# Patient Record
Sex: Female | Born: 1940 | Race: White | Hispanic: No | Smoking: Never smoker
Health system: Southern US, Community
[De-identification: ages and names within clinical notes are randomized; demographics above are authoritative.]

## PROBLEM LIST (undated history)

## (undated) DIAGNOSIS — K5792 Diverticulitis of intestine, part unspecified, without perforation or abscess without bleeding: Secondary | ICD-10-CM

## (undated) DIAGNOSIS — I10 Essential (primary) hypertension: Secondary | ICD-10-CM

## (undated) DIAGNOSIS — G2581 Restless legs syndrome: Secondary | ICD-10-CM

## (undated) DIAGNOSIS — M1990 Unspecified osteoarthritis, unspecified site: Secondary | ICD-10-CM

## (undated) DIAGNOSIS — L409 Psoriasis, unspecified: Secondary | ICD-10-CM

## (undated) HISTORY — DX: Psoriasis, unspecified: L40.9

## (undated) HISTORY — DX: Diverticulitis of intestine, part unspecified, without perforation or abscess without bleeding: K57.92

## (undated) HISTORY — DX: Unspecified osteoarthritis, unspecified site: M19.90

## (undated) HISTORY — DX: Essential (primary) hypertension: I10

---

## 1997-03-10 HISTORY — PX: TENDON RELEASE: SHX230

## 2000-11-08 ENCOUNTER — Encounter: Payer: Self-pay | Admitting: Internal Medicine

## 2000-11-08 LAB — CONVERTED CEMR LAB: Pap Smear: NORMAL

## 2000-12-09 LAB — FECAL OCCULT BLOOD, GUAIAC: Fecal Occult Blood: NEGATIVE

## 2003-12-01 ENCOUNTER — Other Ambulatory Visit: Admission: RE | Admit: 2003-12-01 | Discharge: 2003-12-01 | Payer: Self-pay | Admitting: Internal Medicine

## 2005-07-04 ENCOUNTER — Ambulatory Visit: Payer: Self-pay | Admitting: Internal Medicine

## 2005-07-04 ENCOUNTER — Other Ambulatory Visit: Admission: RE | Admit: 2005-07-04 | Discharge: 2005-07-04 | Payer: Self-pay | Admitting: Internal Medicine

## 2005-07-12 ENCOUNTER — Encounter: Payer: Self-pay | Admitting: Internal Medicine

## 2005-08-21 ENCOUNTER — Ambulatory Visit: Payer: Self-pay | Admitting: Internal Medicine

## 2005-08-30 ENCOUNTER — Ambulatory Visit: Payer: Self-pay | Admitting: Internal Medicine

## 2006-07-11 ENCOUNTER — Ambulatory Visit: Payer: Self-pay | Admitting: Internal Medicine

## 2006-08-05 ENCOUNTER — Ambulatory Visit: Payer: Self-pay | Admitting: Internal Medicine

## 2006-09-11 ENCOUNTER — Ambulatory Visit: Payer: Self-pay | Admitting: Internal Medicine

## 2010-11-03 ENCOUNTER — Encounter: Payer: Self-pay | Admitting: Internal Medicine

## 2010-11-03 ENCOUNTER — Encounter (INDEPENDENT_AMBULATORY_CARE_PROVIDER_SITE_OTHER): Payer: 59 | Admitting: Internal Medicine

## 2010-11-03 ENCOUNTER — Other Ambulatory Visit: Payer: Self-pay | Admitting: Internal Medicine

## 2010-11-03 DIAGNOSIS — I10 Essential (primary) hypertension: Secondary | ICD-10-CM

## 2010-11-03 DIAGNOSIS — Z23 Encounter for immunization: Secondary | ICD-10-CM

## 2010-11-03 DIAGNOSIS — M159 Polyosteoarthritis, unspecified: Secondary | ICD-10-CM | POA: Insufficient documentation

## 2010-11-03 DIAGNOSIS — R32 Unspecified urinary incontinence: Secondary | ICD-10-CM | POA: Insufficient documentation

## 2010-11-03 DIAGNOSIS — M130 Polyarthritis, unspecified: Secondary | ICD-10-CM | POA: Insufficient documentation

## 2010-11-03 DIAGNOSIS — K5730 Diverticulosis of large intestine without perforation or abscess without bleeding: Secondary | ICD-10-CM | POA: Insufficient documentation

## 2010-11-03 DIAGNOSIS — Z0000 Encounter for general adult medical examination without abnormal findings: Secondary | ICD-10-CM

## 2010-11-03 DIAGNOSIS — Z2911 Encounter for prophylactic immunotherapy for respiratory syncytial virus (RSV): Secondary | ICD-10-CM

## 2010-11-03 DIAGNOSIS — M1990 Unspecified osteoarthritis, unspecified site: Secondary | ICD-10-CM | POA: Insufficient documentation

## 2010-11-03 LAB — RENAL FUNCTION PANEL
Albumin: 4 g/dL (ref 3.5–5.2)
BUN: 22 mg/dL (ref 6–23)
CO2: 28 mEq/L (ref 19–32)
Calcium: 9.7 mg/dL (ref 8.4–10.5)
Chloride: 108 mEq/L (ref 96–112)
Creatinine, Ser: 0.7 mg/dL (ref 0.4–1.2)
GFR: 90.9 mL/min (ref >60.00)

## 2010-11-03 LAB — HEPATIC FUNCTION PANEL
ALT: 18 U/L (ref 0–35)
AST: 23 U/L (ref 0–37)
Albumin: 4 g/dL (ref 3.5–5.2)
Alkaline Phosphatase: 72 U/L (ref 39–117)
Bilirubin, Direct: 0.1 mg/dL (ref 0.0–0.3)
Total Protein: 6.6 g/dL (ref 6.0–8.3)

## 2010-11-03 LAB — CBC WITH DIFFERENTIAL/PLATELET
Basophils Absolute: 0 10*3/uL (ref 0.0–0.1)
Eosinophils Absolute: 0.1 10*3/uL (ref 0.0–0.7)
Hemoglobin: 13.2 g/dL (ref 12.0–15.0)
Lymphocytes Relative: 24.2 % (ref 12.0–46.0)
MCHC: 33.8 g/dL (ref 30.0–36.0)
Neutro Abs: 5.4 10*3/uL (ref 1.4–7.7)
Platelets: 249 10*3/uL (ref 150.0–400.0)
RDW: 12.6 % (ref 11.5–14.6)

## 2010-11-07 NOTE — Letter (Signed)
Summary: [facility]: Immunoassay Fecal Occult Blood (iFOB) Order Form  [facility]  [address] [place], [place] [address]   Phone: [phone]  Fax: [phone]      [facility]: Immunoassay Fecal Occult Blood (iFOB) Order Form   November 03, 2010 MRN: [id number]   Brooke Goodman [birth year]   Physicican Name:_________[place]________________  Diagnosis Code:_________V76.51_________________      Cindee Salt MD

## 2010-11-07 NOTE — Assessment & Plan Note (Signed)
Summary: CPX/CLE   [employer] PT DOESN'T HAVE MEDICARE   Vital Signs:  Patient profile:   70 year old female Height:      62.5 inches Weight:      230 pounds BMI:     41.55 O2 Sat:      93 % on Room air Temp:     99.2 degrees F oral Pulse rate:   82 / minute Pulse rhythm:   regular BP sitting:   164 / 94  (left arm) Cuff size:   large  Vitals Entered By: Mervin Hack CMA Duncan Dull) (November 03, 2010 9:36 AM)  O2 Flow:  Room air CC: new patient to re-establish care   History of Present Illness: Reestablishing here hasn't been seen in 4.5 years  Checks her BP occ--runs a bit high No headaches No chest pain No regular exercise  Husband died about a year ago she is doing okay  Sigmoidoscopy in 2004 benign except early diverticuli  Preventive Screening-Counseling & Management  Alcohol-Tobacco     Smoking Status: never  Allergies (verified): No Known Drug Allergies  Past History:  Past Medical History: Diverticulosis, colon Hypertension Psorasis Urinary incontinence--urge Osteoarthritis  Past Surgical History: SVD x 3 Right thumb tendon release -- 7/98  Hyacinth Meeker  Family History: Dad died of prostate cancer in his 40's Mom alive  CAD in half sister Breast cancer in half sister No DM, HTN No colon cancer  Social History: Widowed in 2011 3 children Working at [employer] Alcohol use-no Smoking Status:  never  Review of Systems General:  weight down  ~20# in 4 years usually sleeps okay wears seat belt. Eyes:  Denies double vision and vision loss-1 eye. ENT:  Denies decreased hearing and ringing in ears; teeth okay---due for dentist. CV:  Denies chest pain or discomfort, difficulty breathing at night, difficulty breathing while lying down, fainting, lightheadness, palpitations, and shortness of breath with exertion; Has sense of itching in chest to right of sternum. Resp:  Denies cough and shortness of breath. GI:  Denies abdominal pain,  bloody stools, change in bowel habits, dark tarry stools, indigestion, nausea, and vomiting. GU:  Overactive bladder--urgency and some incontinence. Has to use pads Occ gets sense of prolapse but not all the time . MS:  Complains of joint pain and cramps; denies joint swelling; right shoulder and knee pain--tylenol helps occ uses advil occ cramps in hands. Derm:  Complains of rash; denies lesion(s); still with psoriasis--uses OTC hydrocortisone with good effect Toe fungus---slight pain. Neuro:  Denies headaches, numbness, tingling, and weakness. Psych:  Denies anxiety and depression. Heme:  Denies abnormal bruising and enlarge lymph nodes. Allergy:  Denies seasonal allergies and sneezing.  Physical Exam  General:  alert and normal appearance.   Eyes:  pupils equal, pupils round, and pupils reactive to light.   Ears:  L ear normal.   Mouth:  no erythema, no exudates, and no lesions.   Neck:  supple, no masses, no thyromegaly, no carotid bruits, and no cervical lymphadenopathy.   Breasts:  no abnormal thickening, no tenderness, and no adenopathy.  Very slight cystic changes Lungs:  normal respiratory effort, no intercostal retractions, no accessory muscle use, and normal breath sounds.   Heart:  normal rate, regular rhythm, no murmur, and no gallop.   Abdomen:  soft, non-tender, and no masses.   Msk:  no joint tenderness and no joint swelling.   Pulses:  1+ in feet Extremities:  no edema Mycotic toenails Neurologic:  alert & oriented X3, strength normal in all extremities, and gait normal.   Skin:  no ulcerations.  Scattered psoriatic rash no suspicious lesions Psych:  normally interactive, good eye contact, not anxious appearing, and not depressed appearing.     Impression & Recommendations:  Problem # 1:  PREVENTIVE HEALTH CARE (ICD-V70.0) Assessment Comment Only discussed fitness zostavax mammo doesn't want colon---will do immunoassay  Problem # 2:  HYPERTENSION  (ICD-401.9) Assessment: Deteriorated  needs to go back on meds  did fine on diuretic years ago  Her updated medication list for this problem includes:    Triamterene-hctz 37.5-25 Mg Tabs (Triamterene-hctz) .Marland Kitchen... 1 tab daily for high blood pressure  Orders: TLB-Renal Function Panel (80069-RENAL) TLB-TSH (Thyroid Stimulating Hormone) (84443-TSH) TLB-Hepatic/Liver Function Pnl (80076-HEPATIC) TLB-CBC Platelet - w/Differential (85025-CBCD) Venipuncture (62952)  Problem # 3:  URINARY INCONTINENCE (ICD-788.30) Assessment: Deteriorated urge liked the detrol--will try again No ---will try vesicare since lower tier on insurance  Complete Medication List: 1)  Triamterene-hctz 37.5-25 Mg Tabs (Triamterene-hctz) .Marland Kitchen.. 1 tab daily for high blood pressure 2)  Vesicare 10 Mg Tabs (Solifenacin succinate) .Marland Kitchen.. 1 tab daily for urinary incontinence  Other Orders: Radiology Referral (Radiology)  Patient Instructions: 1)  Please schedule a follow-up appointment in 6 weeks.  2)  Schedule your mammogram.  3)  Complete your hemoccult cards and return them soon.  Prescriptions: VESICARE 10 MG TABS (SOLIFENACIN SUCCINATE) 1 tab daily for urinary incontinence  #30 x 11   Entered and Authorized by:   Cindee Salt MD   Signed by:   Cindee Salt MD on 11/03/2010   Method used:   Electronically to        [facility] [address] [address] [phone]* (retail)       [address] [place], [place]  [id number]       Ph: [phone]       Fax: [phone]   RxID:   [id number] TRIAMTERENE-HCTZ 37.5-25 MG TABS (TRIAMTERENE-HCTZ) 1 tab daily for high blood pressure  #30 x 11   Entered and Authorized by:   Cindee Salt MD   Signed by:   Cindee Salt MD on 11/03/2010   Method used:   Electronically to        [facility] [address] [phone]* (retail)       [address] [place], [place]  [id number]       Ph: [phone]       Fax: [phone]   RxID:    [id number]    Orders Added: 1)  New Patient 65&> [54627] 2)  TLB-Renal Function Panel [80069-RENAL] 3)  TLB-TSH (Thyroid Stimulating Hormone) [84443-TSH] 4)  TLB-Hepatic/Liver Function Pnl [80076-HEPATIC] 5)  TLB-CBC Platelet - w/Differential [85025-CBCD] 6)  Venipuncture [03500] 7)  Radiology Referral [Radiology]   Immunization History:  Tetanus/Td Immunization History:    Tetanus/Td:  historical (06/10/2006)  Pneumovax Immunization History:    Pneumovax:  historical (07/11/2006)   Immunization History:  Tetanus/Td Immunization History:    Tetanus/Td:  Historical (06/10/2006)  Pneumovax Immunization History:    Pneumovax:  Historical (07/11/2006)  Prior Medications: Current Allergies (reviewed today): No known allergies      Preventive Care Screening  Last Pneumovax:    Date:  07/11/2006    Results:  Historical   Last Tetanus Booster:    Date:  06/10/2006    Results:  Historical   Hemoccult:    Date:  12/09/2000    Results:  negative   Pap Smear:    Date:  11/08/2000    Results:  normal   Mammogram:    Date:  10/12/1998    Results:  normal     Pap Smear  Procedure date:  07/12/2005  Findings:      Specimen Adequacy: Satisfactory for evaluation.   Interpretation/Result:Negative for intraepithelial Lesion or Malignancy.     Appended Document: CPX/CLE   [employer] PT DOESN'T HAVE MEDICARE     Clinical Lists Changes  Orders: Added new Service order of Zoster (Shingles) Vaccine Live [phone]) - Signed Added new Service order of Admin 1st Vaccine (85462) - Signed Added new Service order of Admin 1st Vaccine [facility]) [phone]) - Signed Observations: Added new observation of ZOSTAVAX VIS: 06/22/05 given November 03, 2010. (11/03/2010 10:24) Added new observation of ZOSTAVAX LOT: [id number] (11/03/2010 10:24) Added new observation of ZOSTAVAX EXP: 04/28/2011 (11/03/2010 10:24) Added new observation of ZOSTAVAX BY: DeShannon Smith CMA (AAMA)  (11/03/2010 10:24) Added new observation of ZOSTAVAX RTE: [place] (11/03/2010 10:24) Added new observation of ZOSTAVAXDOSE: 0.65  (11/03/2010 10:24) Added new observation of ZOSTAVAX MFR: Merck  (11/03/2010 10:24) Added new observation of ZOSTAVAXSITE: right deltoid  (11/03/2010 10:24) Added new observation of ZOSTAVAX: Zostavax  (11/03/2010 10:24)       Zostavax # 1    Vaccine Type: Zostavax    Site: right deltoid    Mfr: Merck    Dose: 0.65    Route: [place]    Given by: Mervin Hack CMA (AAMA)    Exp. Date: 04/28/2011    Lot #: [id number]    VIS given: 06/22/05 given November 03, 2010.

## 2010-11-10 ENCOUNTER — Encounter (INDEPENDENT_AMBULATORY_CARE_PROVIDER_SITE_OTHER): Payer: Self-pay | Admitting: *Deleted

## 2010-11-10 ENCOUNTER — Other Ambulatory Visit: Payer: Self-pay | Admitting: Internal Medicine

## 2010-11-10 ENCOUNTER — Other Ambulatory Visit: Payer: 59

## 2010-11-10 DIAGNOSIS — Z1211 Encounter for screening for malignant neoplasm of colon: Secondary | ICD-10-CM

## 2010-11-10 LAB — FECAL OCCULT BLOOD, IMMUNOCHEMICAL: Fecal Occult Bld: NEGATIVE

## 2010-11-13 ENCOUNTER — Encounter: Payer: Self-pay | Admitting: Internal Medicine

## 2010-11-21 NOTE — Letter (Signed)
Summary: Results Follow up Letter  [place] [facility]  [address] [place], [place] [address]   Phone: [phone]  Fax: [phone]    11/13/2010 MRN: [id number]  [facility] [address] [place], [place]  [address]  [place]  Dear Ms. Scinto,  The following are the results of your recent test(s):  Test         Result    Pap Smear:        Normal _____  Not Normal _____ Comments: ______________________________________________________ Cholesterol: LDL(Bad cholesterol):         Your goal is less than:         HDL (Good cholesterol):       Your goal is more than: Comments:  ______________________________________________________ Mammogram:        Normal _____  Not Normal _____ Comments:  ___________________________________________________________________ Hemoccult:        Normal __X___  Not normal _______ Comments:  stool test doesn't show any sign of blood, we will plan to repeat this again next year  _____________________________________________________________________ Other Tests:    We routinely do not discuss normal results over the telephone.  If you desire a copy of the results, or you have any questions about this information we can discuss them at your next office visit.   Sincerely,      Tillman Abide, MD

## 2010-12-01 ENCOUNTER — Ambulatory Visit: Payer: Self-pay | Admitting: Internal Medicine

## 2010-12-06 ENCOUNTER — Encounter: Payer: Self-pay | Admitting: *Deleted

## 2010-12-12 ENCOUNTER — Encounter: Payer: Self-pay | Admitting: Internal Medicine

## 2010-12-12 ENCOUNTER — Ambulatory Visit (INDEPENDENT_AMBULATORY_CARE_PROVIDER_SITE_OTHER): Payer: 59 | Admitting: Internal Medicine

## 2010-12-12 VITALS — BP 140/90 | HR 72 | Temp 98.6°F | Ht 62.5 in | Wt 227.0 lb

## 2010-12-12 DIAGNOSIS — R32 Unspecified urinary incontinence: Secondary | ICD-10-CM

## 2010-12-12 DIAGNOSIS — I10 Essential (primary) hypertension: Secondary | ICD-10-CM

## 2010-12-12 NOTE — Progress Notes (Signed)
  Subjective:    Patient ID: Brooke Goodman, female    DOB: 04-Sep-1941, 70 y.o.   MRN: [id number]  HPI Doing fine on the BP med Hasn't been checking No headaches No dizziness or syncope No chest pain  No SOB  Has been on the bladder med Does fine throughout the day Still incontinence first thing in the AM---just can't make it there No nocturia in general  No past medical history on file.  No past surgical history on file.  No family history on file.  History   Social History  . Marital Status: Married    Spouse Name: N/A    Number of Children: N/A  . Years of Education: N/A   Occupational History  . Not on file.   Social History Main Topics  . Smoking status: Never Smoker   . Smokeless tobacco: Never Used  . Alcohol Use: Not on file  . Drug Use: Not on file  . Sexually Active: Not on file   Other Topics Concern  . Not on file   Social History Narrative  . No narrative on file      Review of Systems Weight down 3# Appetite is okay No sig dry mouth    Objective:   Physical Exam  Constitutional: She appears well-developed and well-nourished. No distress.  Neck: Normal range of motion. No thyromegaly present.  Cardiovascular: Normal rate, regular rhythm and normal heart sounds.  Exam reveals no gallop.   No murmur heard. Pulmonary/Chest: Effort normal and breath sounds normal. No respiratory distress. She has no wheezes. She has no rales.  Musculoskeletal: She exhibits no edema and no tenderness.  Lymphadenopathy:    She has no cervical adenopathy.  Psychiatric: She has a normal mood and affect. Her behavior is normal. Judgment and thought content normal.          Assessment & Plan:

## 2010-12-13 ENCOUNTER — Encounter: Payer: Self-pay | Admitting: Internal Medicine

## 2010-12-13 LAB — BASIC METABOLIC PANEL
CO2: 29 mEq/L (ref 19–32)
Calcium: 9.4 mg/dL (ref 8.4–10.5)
Creatinine, Ser: 0.8 mg/dL (ref 0.4–1.2)
GFR: 78.73 mL/min (ref >60.00)
Glucose, Bld: 89 mg/dL (ref 70–99)

## 2011-06-15 ENCOUNTER — Encounter: Payer: Self-pay | Admitting: Internal Medicine

## 2011-06-19 ENCOUNTER — Encounter: Payer: Self-pay | Admitting: Internal Medicine

## 2011-06-19 ENCOUNTER — Ambulatory Visit (INDEPENDENT_AMBULATORY_CARE_PROVIDER_SITE_OTHER): Payer: 59 | Admitting: Internal Medicine

## 2011-06-19 DIAGNOSIS — I10 Essential (primary) hypertension: Secondary | ICD-10-CM

## 2011-06-19 DIAGNOSIS — R32 Unspecified urinary incontinence: Secondary | ICD-10-CM

## 2011-06-19 DIAGNOSIS — M130 Polyarthritis, unspecified: Secondary | ICD-10-CM

## 2011-06-19 NOTE — Assessment & Plan Note (Signed)
Does okay with the vesicare Just early AM urgency and threat of incontinence She will try it at night Consider change to generic detrol

## 2011-06-19 NOTE — Patient Instructions (Signed)
Please take arthrtis tylenol (acetaminophen) regularly three times daily and only use aleve/advil when pain is bad Please check your blood pressure ~monthly. Call for new med if regularly over 140/90

## 2011-06-19 NOTE — Progress Notes (Signed)
  Subjective:    Patient ID: Brooke Goodman, female    DOB: [birth year], 70 y.o.   MRN: [id number]  HPI Doing fine No new concerns  Did check BP once with daughter's machine 128/78 then No headaches No chest pain or SOB No edema  Some joint pains Ankle, knee and shoulder on right Used advil or aleve---just about daily. Did take some this AM  Still on the vesicare Helps in the day but still with trouble making it to bathroom first thing in AM Hasn't tried it at nighttime yet  Current Outpatient Prescriptions on File Prior to Visit  Medication Sig Dispense Refill  . solifenacin (VESICARE) 10 MG tablet Take 5 mg by mouth daily.        Marland Kitchen triamterene-hydrochlorothiazide (MAXZIDE-25) 37.5-25 MG per tablet Take 1 by mouth once daily        No Known Allergies  Past Medical History  Diagnosis Date  . Diverticulitis   . Hypertension   . Psoriasis   . Urinary incontinence   . Osteoarthritis     Past Surgical History  Procedure Date  . Vaginal delivery     x3  . Tendon release 07/98    right thumb    Family History  Problem Relation Age of Onset  . Coronary artery disease Sister   . Cancer Sister     breast  . Hypertension Neg Hx   . Diabetes Neg Hx     History   Social History  . Marital Status: Married    Spouse Name: N/A    Number of Children: 3  . Years of Education: N/A   Occupational History  . [employer]    Social History Main Topics  . Smoking status: Never Smoker   . Smokeless tobacco: Never Used  . Alcohol Use: No  . Drug Use: No  . Sexually Active: Not on file   Other Topics Concern  . Not on file   Social History Narrative  . 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 No distress.    Neck: Normal range of motion. Neck supple.  Cardiovascular: Normal rate, regular rhythm and normal heart sounds.  Exam reveals no gallop.   No murmur heard. Pulmonary/Chest: Effort normal and breath sounds normal. No respiratory distress. She has no wheezes. She has no rales.  Musculoskeletal: She exhibits no edema and no tenderness.  Lymphadenopathy:    She has no cervical adenopathy.  Psychiatric: She has a normal mood and affect. Her behavior is normal. Judgment and thought content normal.          Assessment & Plan:

## 2011-06-19 NOTE — Assessment & Plan Note (Signed)
BP Readings from Last 3 Encounters:  06/19/11 177/97  12/12/10 140/90  11/03/10 164/94   High again today May have some white coat component Will try to limit NSAIDs, work on fitness Will have her check BP---change to lisinopril/HCT if up at home

## 2011-06-19 NOTE — Assessment & Plan Note (Signed)
Ongoing multiple sites Will try regular tylenol and advil/aleve only prn

## 2011-10-26 ENCOUNTER — Other Ambulatory Visit: Payer: Self-pay | Admitting: Internal Medicine

## 2011-12-19 ENCOUNTER — Encounter: Payer: 59 | Admitting: Internal Medicine

## 2012-01-18 ENCOUNTER — Ambulatory Visit (INDEPENDENT_AMBULATORY_CARE_PROVIDER_SITE_OTHER): Payer: 59 | Admitting: Internal Medicine

## 2012-01-18 ENCOUNTER — Encounter: Payer: Self-pay | Admitting: Internal Medicine

## 2012-01-18 VITALS — BP 142/86 | HR 80 | Temp 97.8°F | Wt 228.0 lb

## 2012-01-18 DIAGNOSIS — Z0000 Encounter for general adult medical examination without abnormal findings: Secondary | ICD-10-CM

## 2012-01-18 DIAGNOSIS — I10 Essential (primary) hypertension: Secondary | ICD-10-CM

## 2012-01-18 DIAGNOSIS — R32 Unspecified urinary incontinence: Secondary | ICD-10-CM

## 2012-01-18 DIAGNOSIS — L408 Other psoriasis: Secondary | ICD-10-CM

## 2012-01-18 DIAGNOSIS — L409 Psoriasis, unspecified: Secondary | ICD-10-CM

## 2012-01-18 LAB — CBC WITH DIFFERENTIAL/PLATELET
Basophils Relative: 0.8 % (ref 0.0–3.0)
Eosinophils Absolute: 0.1 10*3/uL (ref 0.0–0.7)
Hemoglobin: 13.4 g/dL (ref 12.0–15.0)
MCHC: 33.3 g/dL (ref 30.0–36.0)
MCV: 91.7 fl (ref 78.0–100.0)
Monocytes Absolute: 0.6 10*3/uL (ref 0.1–1.0)
Neutro Abs: 5.7 10*3/uL (ref 1.4–7.7)
Neutrophils Relative %: 67.2 % (ref 43.0–77.0)
RBC: 4.41 Mil/uL (ref 3.87–5.11)
RDW: 12.2 % (ref 11.5–14.6)

## 2012-01-18 LAB — BASIC METABOLIC PANEL
CO2: 29 mEq/L (ref 19–32)
Chloride: 100 mEq/L (ref 96–112)
Creatinine, Ser: 0.9 mg/dL (ref 0.4–1.2)

## 2012-01-18 LAB — HEPATIC FUNCTION PANEL
ALT: 18 U/L (ref 0–35)
Total Protein: 7.3 g/dL (ref 6.0–8.3)

## 2012-01-18 LAB — LDL CHOLESTEROL, DIRECT: Direct LDL: 125 mg/dL

## 2012-01-18 LAB — LIPID PANEL
Cholesterol: 205 mg/dL — ABNORMAL HIGH (ref 0–200)
Total CHOL/HDL Ratio: 3

## 2012-01-18 MED ORDER — NYSTATIN 100000 UNIT/GM EX POWD: Freq: Three times a day (TID) | CUTANEOUS | Status: AC | PRN

## 2012-01-18 MED ORDER — TRIAMCINOLONE ACETONIDE 0.1 % EX LOTN: TOPICAL_LOTION | Freq: Three times a day (TID) | CUTANEOUS | Status: AC | PRN

## 2012-01-18 NOTE — Assessment & Plan Note (Signed)
Will Rx triamcinolone for psoriasis Mycostatin for under breasts

## 2012-01-18 NOTE — Assessment & Plan Note (Signed)
BP Readings from Last 3 Encounters:  01/18/12 142/86  06/19/11 177/97  12/12/10 140/90   BP always good at home No changes Check labs

## 2012-01-18 NOTE — Progress Notes (Signed)
Subjective:    Patient ID: Brooke Goodman, female    DOB: 24-Oct-1940, 71 y.o.   MRN: [id number]  HPI Here for physical Still works part time  Has been monitoring BP regularly 108/74- 141/83 Most systolics under 130  Still taking the bladder med Hard to tell if it is helping Still just has AM problems with first void  No sig arthritis pain Some right knee limitations but not really painful No set exercise  Current Outpatient Prescriptions on File Prior to Visit  Medication Sig Dispense Refill  . acetaminophen (TYLENOL) 650 MG CR tablet Take 650 mg by mouth 3 (three) times daily.        Marland Kitchen triamterene-hydrochlorothiazide (MAXZIDE-25) 37.5-25 MG per tablet take 1 tablet by mouth once daily for high blood pressure  30 tablet  11  . VESICARE 10 MG tablet take 1 tablet by mouth once daily for URINARY INCONTINENCE  30 tablet  11    No Known Allergies  Past Medical History  Diagnosis Date  . Diverticulitis   . Hypertension   . Psoriasis   . Urinary incontinence   . Osteoarthritis     Past Surgical History  Procedure Date  . Vaginal delivery     x3  . Tendon release 07/98    right thumb    Family History  Problem Relation Age of Onset  . Coronary artery disease Sister   . Cancer Sister     breast  . Hypertension Neg Hx   . Diabetes Neg Hx     History   Social History  . Marital Status: Widowed    Spouse Name: N/A    Number of Children: 3  . Years of Education: N/A   Occupational History  . [employer]     part time   Social History Main Topics  . Smoking status: Never Smoker   . Smokeless tobacco: Never Used  . Alcohol Use: No  . Drug Use: No  . Sexually Active: Not on file   Other Topics Concern  . Not on file   Social History Narrative   Widowed 2011No living willWould want daughter Burna Mortimer to make health care decisions for herWould want attempts at resuscitationProbably would accept tube feeds   Review of Systems  Constitutional: Negative  for fatigue and unexpected weight change.       Did gain some weight on recent cruise Wears seat belt  HENT: Negative for hearing loss, congestion, rhinorrhea, dental problem and tinnitus.        Regular with dentist  Eyes: Negative for visual disturbance.       No unilateral vision loss or diplopia  Respiratory: Negative for cough, chest tightness and shortness of breath.   Cardiovascular: Negative for chest pain, palpitations and leg swelling.  Gastrointestinal: Negative for nausea, vomiting, abdominal pain, constipation and blood in stool.       No heartburn  Genitourinary: Negative for frequency and difficulty urinating.       Trouble with continence on first AM void  Musculoskeletal: Positive for arthralgias. Negative for back pain and joint swelling.  Skin: Positive for rash.       Chronic psoriasis--under breasts and on back  Neurological: Negative for dizziness, syncope, weakness, light-headedness, numbness and headaches.  Hematological: Negative for adenopathy. Does not bruise/bleed easily.       Easy bruising gone after stopped NSAIDs  Psychiatric/Behavioral: Negative for sleep disturbance and dysphoric mood. The patient is not nervous/anxious.  Objective:   Physical Exam  Constitutional: She is oriented to person, place, and time. She appears well-developed and well-nourished. No distress.  HENT:  Head: Normocephalic and atraumatic.  Right Ear: External ear normal.  Left Ear: External ear normal.  Mouth/Throat: Oropharynx is clear and moist. No oropharyngeal exudate.  Eyes: Conjunctivae and EOM are normal. Pupils are equal, round, and reactive to light.  Neck: Normal range of motion. Neck supple. No thyromegaly present.  Cardiovascular: Normal rate, regular rhythm, normal heart sounds and intact distal pulses.  Exam reveals no gallop.   No murmur heard. Pulmonary/Chest: Effort normal and breath sounds normal. No respiratory distress. She has no wheezes. She has no  rales.  Abdominal: Soft. There is no tenderness.  Genitourinary:       No breast masses or discharge  Musculoskeletal: She exhibits no edema and no tenderness.  Lymphadenopathy:    She has no cervical adenopathy.    She has no axillary adenopathy.  Neurological: She is alert and oriented to person, place, and time.  Skin: Rash noted.       Psoriatic plaque on back Intertriginous rash under breasts  Psychiatric: She has a normal mood and affect. Her behavior is normal. Thought content normal.          Assessment & Plan:

## 2012-01-18 NOTE — Assessment & Plan Note (Signed)
Generally healthy but poor conditioning Discussed trying to walk regularly Will defer mammo till next year Stool immunoassay

## 2012-01-18 NOTE — Assessment & Plan Note (Signed)
Asked her to try without the med to be sure it was helping

## 2012-01-22 ENCOUNTER — Encounter: Payer: Self-pay | Admitting: *Deleted

## 2012-01-25 ENCOUNTER — Other Ambulatory Visit: Payer: 59

## 2012-01-25 DIAGNOSIS — Z1211 Encounter for screening for malignant neoplasm of colon: Secondary | ICD-10-CM

## 2012-01-25 LAB — FECAL OCCULT BLOOD, IMMUNOCHEMICAL: Fecal Occult Bld: NEGATIVE

## 2012-01-28 ENCOUNTER — Encounter: Payer: Self-pay | Admitting: *Deleted

## 2012-10-21 ENCOUNTER — Other Ambulatory Visit: Payer: Self-pay | Admitting: Internal Medicine

## 2013-01-29 ENCOUNTER — Encounter: Payer: 59 | Admitting: Internal Medicine

## 2013-07-22 ENCOUNTER — Encounter: Payer: Self-pay | Admitting: Internal Medicine

## 2013-07-22 ENCOUNTER — Ambulatory Visit (INDEPENDENT_AMBULATORY_CARE_PROVIDER_SITE_OTHER): Payer: BC Managed Care – PPO | Admitting: Internal Medicine

## 2013-07-22 VITALS — BP 140/80 | HR 71 | Temp 98.1°F | Ht 62.0 in | Wt 233.0 lb

## 2013-07-22 DIAGNOSIS — M1990 Unspecified osteoarthritis, unspecified site: Secondary | ICD-10-CM

## 2013-07-22 DIAGNOSIS — Z0000 Encounter for general adult medical examination without abnormal findings: Secondary | ICD-10-CM

## 2013-07-22 DIAGNOSIS — I10 Essential (primary) hypertension: Secondary | ICD-10-CM

## 2013-07-22 DIAGNOSIS — Z1211 Encounter for screening for malignant neoplasm of colon: Secondary | ICD-10-CM

## 2013-07-22 DIAGNOSIS — R32 Unspecified urinary incontinence: Secondary | ICD-10-CM

## 2013-07-22 LAB — CBC WITH DIFFERENTIAL/PLATELET
Eosinophils Absolute: 0.1 10*3/uL (ref 0.0–0.7)
Eosinophils Relative: 1 % (ref 0.0–5.0)
HCT: 37.6 % (ref 36.0–46.0)
Hemoglobin: 12.5 g/dL (ref 12.0–15.0)
Lymphocytes Relative: 22.9 % (ref 12.0–46.0)
Lymphs Abs: 1.9 10*3/uL (ref 0.7–4.0)
MCHC: 33.3 g/dL (ref 30.0–36.0)
Monocytes Relative: 6.3 % (ref 3.0–12.0)
Platelets: 280 10*3/uL (ref 150.0–400.0)
RDW: 12.6 % (ref 11.5–14.6)
WBC: 8.5 10*3/uL (ref 4.5–10.5)

## 2013-07-22 LAB — BASIC METABOLIC PANEL
BUN: 30 mg/dL — ABNORMAL HIGH (ref 6–23)
Calcium: 9.8 mg/dL (ref 8.4–10.5)
Creatinine, Ser: 1 mg/dL (ref 0.4–1.2)
GFR: 58.48 mL/min — ABNORMAL LOW (ref >60.00)

## 2013-07-22 LAB — HEPATIC FUNCTION PANEL
ALT: 17 U/L (ref 0–35)
AST: 25 U/L (ref 0–37)
Albumin: 4 g/dL (ref 3.5–5.2)
Alkaline Phosphatase: 65 U/L (ref 39–117)
Bilirubin, Direct: 0 mg/dL (ref 0.0–0.3)
Total Bilirubin: 0.8 mg/dL (ref 0.3–1.2)

## 2013-07-22 LAB — TSH: TSH: 1.96 u[IU]/mL (ref 0.35–5.50)

## 2013-07-22 MED ORDER — CLOBETASOL PROPIONATE 0.05 % EX CREA: 1.0000 "application " | TOPICAL_CREAM | Freq: Two times a day (BID) | CUTANEOUS | Status: DC

## 2013-07-22 NOTE — Patient Instructions (Signed)
Please set up your screening mammogram.  DASH Diet The DASH diet stands for "Dietary Approaches to Stop Hypertension." It is a healthy eating plan that has been shown to reduce high blood pressure (hypertension) in as little as 14 days, while also possibly providing other significant health benefits. These other health benefits include reducing the risk of breast cancer after menopause and reducing the risk of type 2 diabetes, heart disease, colon cancer, and stroke. Health benefits also include weight loss and slowing kidney failure in patients with chronic kidney disease.  DIET GUIDELINES  Limit salt (sodium). Your diet should contain less than 1500 mg of sodium daily.  Limit refined or processed carbohydrates. Your diet should include mostly whole grains. Desserts and added sugars should be used sparingly.  Include small amounts of heart-healthy fats. These types of fats include nuts, oils, and tub margarine. Limit saturated and trans fats. These fats have been shown to be harmful in the body. CHOOSING FOODS  The following food groups are based on a 2000 calorie diet. See your Registered Dietitian for individual calorie needs. Grains and Grain Products (6 to 8 servings daily)  Eat More Often: Whole-wheat bread, brown rice, whole-grain or wheat pasta, quinoa, popcorn without added fat or salt (air popped).  Eat Less Often: White bread, white pasta, white rice, cornbread. Vegetables (4 to 5 servings daily)  Eat More Often: Fresh, frozen, and canned vegetables. Vegetables may be raw, steamed, roasted, or grilled with a minimal amount of fat.  Eat Less Often/Avoid: Creamed or fried vegetables. Vegetables in a cheese sauce. Fruit (4 to 5 servings daily)  Eat More Often: All fresh, canned (in natural juice), or frozen fruits. Dried fruits without added sugar. One hundred percent fruit juice ( cup [237 mL] daily).  Eat Less Often: Dried fruits with added sugar. Canned fruit in light or heavy  syrup. Lean Meats, Fish, and Poultry (2 servings or less daily. One serving is 3 to 4 oz [85-114 g]).  Eat More Often: Ninety percent or leaner ground beef, tenderloin, sirloin. Round cuts of beef, chicken breast, turkey breast. All fish. Grill, bake, or broil your meat. Nothing should be fried.  Eat Less Often/Avoid: Fatty cuts of meat, turkey, or chicken leg, thigh, or wing. Fried cuts of meat or fish. Dairy (2 to 3 servings)  Eat More Often: Low-fat or fat-free milk, low-fat plain or light yogurt, reduced-fat or part-skim cheese.  Eat Less Often/Avoid: Milk (whole, 2%).Whole milk yogurt. Full-fat cheeses. Nuts, Seeds, and Legumes (4 to 5 servings per week)  Eat More Often: All without added salt.  Eat Less Often/Avoid: Salted nuts and seeds, canned beans with added salt. Fats and Sweets (limited)  Eat More Often: Vegetable oils, tub margarines without trans fats, sugar-free gelatin. Mayonnaise and salad dressings.  Eat Less Often/Avoid: Coconut oils, palm oils, butter, stick margarine, cream, half and half, cookies, candy, pie. FOR MORE INFORMATION The Dash Diet Eating Plan: www.dashdiet.org Document Released: 08/16/2011 Document Revised: 11/19/2011 Document Reviewed: 08/16/2011 [employer] Patient Information 2014 [employer].  

## 2013-07-22 NOTE — Assessment & Plan Note (Signed)
Uses OTC meds Discussed exercise

## 2013-07-22 NOTE — Progress Notes (Signed)
Subjective:    Patient ID: Brooke Goodman, female    DOB: 04-15-1941, 72 y.o.   MRN: [id number]  HPI Here for physical Doing well Still works and has been busy there No changes in FH Reviewed advanced directives  Still has the urinary incontinence Generally at night---gets up 3-4 times at night. Has to wear pad Stopped vesicare and had no change so stayed off Discussed meds---she is okay with holding off  Current Outpatient Prescriptions on File Prior to Visit  Medication Sig Dispense Refill  . acetaminophen (TYLENOL) 650 MG CR tablet Take 650 mg by mouth 3 (three) times daily.        Marland Kitchen triamterene-hydrochlorothiazide (MAXZIDE-25) 37.5-25 MG per tablet take 1 tablet by mouth once daily for high blood pressure  30 tablet  11   No current facility-administered medications on file prior to visit.    No Known Allergies  Past Medical History  Diagnosis Date  . Diverticulitis   . Hypertension   . Psoriasis   . Urinary incontinence   . Osteoarthritis     Past Surgical History  Procedure Laterality Date  . Vaginal delivery      x3  . Tendon release  07/98    right thumb    Family History  Problem Relation Age of Onset  . Coronary artery disease Sister   . Cancer Sister     breast  . Hypertension Neg Hx   . Diabetes Neg Hx     History   Social History  . Marital Status: Widowed    Spouse Name: N/A    Number of Children: 3  . Years of Education: N/A   Occupational History  . [employer]     part time   Social History Main Topics  . Smoking status: Never Smoker   . Smokeless tobacco: Never Used  . Alcohol Use: No  . Drug Use: No  . Sexual Activity: Not on file   Other Topics Concern  . Not on file   Social History Narrative   Widowed 2011   No living will   Would want daughter Burna Mortimer to make health care decisions for her   Would want attempts at resuscitation   Probably would accept tube feeds   Review of Systems  Constitutional: Positive  for unexpected weight change. Negative for fatigue.       Weight is up 5# Wears seat belt  HENT: Negative for congestion, dental problem, hearing loss, rhinorrhea and tinnitus.        Regular with dentist  Eyes: Negative for visual disturbance.       No diplopia or unilateral vision loss  Respiratory: Negative for cough, chest tightness and shortness of breath.   Cardiovascular: Positive for palpitations. Negative for chest pain and leg swelling.       Gets palpitations if drinks too much caffeine  Gastrointestinal: Negative for nausea, vomiting, abdominal pain, constipation and blood in stool.       No heartburn  Endocrine: Negative for cold intolerance and heat intolerance.  Genitourinary: Positive for urgency and frequency. Negative for dysuria and difficulty urinating.  Musculoskeletal: Positive for arthralgias. Negative for joint swelling.       Shoulders and right knee--uses tylenol and occ advil  Skin: Positive for rash.       Still with rash--antifungal didn't work but clobetasol did No suspicious lesion Same psoriasis but not as bad  Allergic/Immunologic: Negative for environmental allergies and immunocompromised state.  Neurological: Negative for dizziness, syncope,  weakness, light-headedness, numbness and headaches.  Hematological: Negative for adenopathy. Does not bruise/bleed easily.  Psychiatric/Behavioral: Negative for sleep disturbance and dysphoric mood. The patient is not nervous/anxious.        Objective:   Physical Exam  Constitutional: She is oriented to person, place, and time. She appears well-developed and well-nourished. No distress.  HENT:  Head: Normocephalic and atraumatic.  Right Ear: External ear normal.  Left Ear: External ear normal.  Mouth/Throat: Oropharynx is clear and moist. No oropharyngeal exudate.  Eyes: Conjunctivae and EOM are normal. Pupils are equal, round, and reactive to light.  Neck: Normal range of motion. Neck supple. No thyromegaly  present.  Cardiovascular: Normal rate, regular rhythm, normal heart sounds and intact distal pulses.  Exam reveals no gallop.   No murmur heard. Pulmonary/Chest: Effort normal and breath sounds normal. No respiratory distress. She has no wheezes. She has no rales.  Abdominal: Soft. There is no tenderness.  Genitourinary:  No breast masses or tenderness  Musculoskeletal: She exhibits no edema and no tenderness.  Lymphadenopathy:    She has no cervical adenopathy.    She has no axillary adenopathy.  Neurological: She is alert and oriented to person, place, and time.  Skin: Rash noted.  Scattered psoriatic plaques  Psychiatric: She has a normal mood and affect. Her behavior is normal.          Assessment & Plan:

## 2013-07-22 NOTE — Assessment & Plan Note (Signed)
BP Readings from Last 3 Encounters:  07/22/13 140/80  01/18/12 142/86  06/19/11 177/97   Good control Due for labs

## 2013-07-22 NOTE — Assessment & Plan Note (Signed)
Only night problems If worsens, would consider trial with nortriptylline or imipramine

## 2013-07-22 NOTE — Assessment & Plan Note (Signed)
Due for mammogram Will do fecal immunoassay again Discussed fitness

## 2013-07-22 NOTE — Assessment & Plan Note (Signed)
DASH diet info given Discussed exercise

## 2013-07-27 ENCOUNTER — Encounter: Payer: Self-pay | Admitting: *Deleted

## 2013-07-30 ENCOUNTER — Other Ambulatory Visit (INDEPENDENT_AMBULATORY_CARE_PROVIDER_SITE_OTHER): Payer: BC Managed Care – PPO

## 2013-07-30 ENCOUNTER — Encounter: Payer: Self-pay | Admitting: *Deleted

## 2013-07-30 DIAGNOSIS — Z1211 Encounter for screening for malignant neoplasm of colon: Secondary | ICD-10-CM

## 2013-10-06 ENCOUNTER — Encounter: Payer: Self-pay | Admitting: Internal Medicine

## 2013-10-06 ENCOUNTER — Ambulatory Visit: Payer: Self-pay | Admitting: Internal Medicine

## 2013-10-07 ENCOUNTER — Encounter: Payer: Self-pay | Admitting: *Deleted

## 2013-10-14 ENCOUNTER — Other Ambulatory Visit: Payer: Self-pay | Admitting: Internal Medicine

## 2014-07-23 ENCOUNTER — Ambulatory Visit (INDEPENDENT_AMBULATORY_CARE_PROVIDER_SITE_OTHER): Payer: Medicare Other | Admitting: Internal Medicine

## 2014-07-23 ENCOUNTER — Encounter: Payer: Self-pay | Admitting: Internal Medicine

## 2014-07-23 VITALS — BP 140/80 | HR 60 | Temp 98.1°F | Wt 221.0 lb

## 2014-07-23 DIAGNOSIS — Z0000 Encounter for general adult medical examination without abnormal findings: Secondary | ICD-10-CM

## 2014-07-23 DIAGNOSIS — Z23 Encounter for immunization: Secondary | ICD-10-CM

## 2014-07-23 DIAGNOSIS — I10 Essential (primary) hypertension: Secondary | ICD-10-CM

## 2014-07-23 DIAGNOSIS — Z7189 Other specified counseling: Secondary | ICD-10-CM

## 2014-07-23 DIAGNOSIS — N3941 Urge incontinence: Secondary | ICD-10-CM

## 2014-07-23 DIAGNOSIS — L570 Actinic keratosis: Secondary | ICD-10-CM

## 2014-07-23 LAB — COMPREHENSIVE METABOLIC PANEL
ALBUMIN: 3.7 g/dL (ref 3.5–5.2)
ALT: 19 U/L (ref 0–35)
AST: 25 U/L (ref 0–37)
Alkaline Phosphatase: 74 U/L (ref 39–117)
BILIRUBIN TOTAL: 0.4 mg/dL (ref 0.2–1.2)
BUN: 29 mg/dL — ABNORMAL HIGH (ref 6–23)
CO2: 29 mEq/L (ref 19–32)
Calcium: 10.2 mg/dL (ref 8.4–10.5)
Chloride: 101 mEq/L (ref 96–112)
Creatinine, Ser: 1 mg/dL (ref 0.4–1.2)
GFR: 61.16 mL/min (ref >60.00)
Glucose, Bld: 105 mg/dL — ABNORMAL HIGH (ref 70–99)
POTASSIUM: 4.1 meq/L (ref 3.5–5.1)
SODIUM: 139 meq/L (ref 135–145)
TOTAL PROTEIN: 7.6 g/dL (ref 6.0–8.3)

## 2014-07-23 LAB — CBC WITH DIFFERENTIAL/PLATELET
BASOS ABS: 0.1 10*3/uL (ref 0.0–0.1)
Basophils Relative: 0.7 % (ref 0.0–3.0)
Eosinophils Absolute: 0.2 10*3/uL (ref 0.0–0.7)
Eosinophils Relative: 1.6 % (ref 0.0–5.0)
HCT: 41.9 % (ref 36.0–46.0)
Hemoglobin: 13.3 g/dL (ref 12.0–15.0)
Lymphocytes Relative: 25.9 % (ref 12.0–46.0)
Lymphs Abs: 2.5 10*3/uL (ref 0.7–4.0)
MCHC: 31.8 g/dL (ref 30.0–36.0)
MCV: 93.3 fl (ref 78.0–100.0)
MONO ABS: 0.7 10*3/uL (ref 0.1–1.0)
Monocytes Relative: 7 % (ref 3.0–12.0)
NEUTROS PCT: 64.8 % (ref 43.0–77.0)
Neutro Abs: 6.3 10*3/uL (ref 1.4–7.7)
PLATELETS: 306 10*3/uL (ref 150.0–400.0)
RBC: 4.49 Mil/uL (ref 3.87–5.11)
RDW: 12.8 % (ref 11.5–15.5)
WBC: 9.7 10*3/uL (ref 4.0–10.5)

## 2014-07-23 LAB — T4, FREE: FREE T4: 1 ng/dL (ref 0.60–1.60)

## 2014-07-23 NOTE — Patient Instructions (Signed)
Exercise to Lose Weight Exercise and a healthy diet may help you lose weight. Your doctor may suggest specific exercises. EXERCISE IDEAS AND TIPS  Choose low-cost things you enjoy doing, such as walking, bicycling, or exercising to workout videos.  Take stairs instead of the elevator.  Walk during your lunch break.  Park your car further away from work or school.  Go to a gym or an exercise class.  Start with 5 to 10 minutes of exercise each day. Build up to 30 minutes of exercise 4 to 6 days a week.  Wear shoes with good support and comfortable clothes.  Stretch before and after working out.  Work out until you breathe harder and your heart beats faster.  Drink extra water when you exercise.  Do not do so much that you hurt yourself, feel dizzy, or get very short of breath. Exercises that burn about 150 calories:  Running 1  miles in 15 minutes.  Playing volleyball for 45 to 60 minutes.  Washing and waxing a car for 45 to 60 minutes.  Playing touch football for 45 minutes.  Walking 1  miles in 35 minutes.  Pushing a stroller 1  miles in 30 minutes.  Playing basketball for 30 minutes.  Raking leaves for 30 minutes.  Bicycling 5 miles in 30 minutes.  Walking 2 miles in 30 minutes.  Dancing for 30 minutes.  Shoveling snow for 15 minutes.  Swimming laps for 20 minutes.  Walking up stairs for 15 minutes.  Bicycling 4 miles in 15 minutes.  Gardening for 30 to 45 minutes.  Jumping rope for 15 minutes.  Washing windows or floors for 45 to 60 minutes. Document Released: 09/29/2010 Document Revised: 11/19/2011 Document Reviewed: 09/29/2010 Exit[employer] Patient Information 2015 [employer]. This information is not intended to replace advice given to you by your health care provider. Make sure you discuss any questions you have with your health care provider.  

## 2014-07-23 NOTE — Assessment & Plan Note (Signed)
BP Readings from Last 3 Encounters:  07/23/14 140/80  07/22/13 140/80  01/18/12 142/86   Reasonable control No changes

## 2014-07-23 NOTE — Assessment & Plan Note (Signed)
See social history 

## 2014-07-23 NOTE — Progress Notes (Signed)
Pre visit review using our clinic review tool, if applicable. No additional management support is needed unless otherwise documented below in the visit note. 

## 2014-07-23 NOTE — Assessment & Plan Note (Signed)
Only at night Satisfied with just using pad

## 2014-07-23 NOTE — Progress Notes (Signed)
Subjective:    Patient ID: Brooke Goodman, female    DOB: March 29, 1941, 73 y.o.   MRN: [id number]  HPI Here for physical and Medicare wellness Reviewed her form and advanced directives Reviewed other doctors--only eye doctor--needs cataract done Vision and hearing are fine No alcohol or tobacco. No regular exercise. Still works part time No falls No depression or anhedonia Still independent with instrumental ADLs Mild memory issues--nothing new or worrisome.  Continues on BP meds Did check BP at home recently--- 115/70-120/74  Ongoing knee pain Doesn't do exercise Tylenol some help. Rare aleve  Ongoing nocturia Frequency and urgency but does fine in day Wears pad just in case (often wet in AM before she can make it for first void)  Current Outpatient Prescriptions on File Prior to Visit  Medication Sig Dispense Refill  . acetaminophen (TYLENOL) 650 MG CR tablet Take 650 mg by mouth 3 (three) times daily.      . Cholecalciferol (VITAMIN D-3) 1000 UNITS CAPS Take by mouth daily.    . clobetasol cream (TEMOVATE) 5.02 % Apply 1 application topically 2 (two) times daily. 60 g 1  . Multiple Vitamin (MULTIVITAMIN) tablet Take 1 tablet by mouth daily.    Marland Kitchen triamterene-hydrochlorothiazide (MAXZIDE-25) 37.5-25 MG per tablet take 1 tablet by mouth once daily for high blood pressure 30 tablet 11   No current facility-administered medications on file prior to visit.    No Known Allergies  Past Medical History  Diagnosis Date  . Diverticulitis   . Hypertension   . Psoriasis   . Urinary incontinence   . Osteoarthritis     Past Surgical History  Procedure Laterality Date  . Vaginal delivery      x3  . Tendon release  07/98    right thumb    Family History  Problem Relation Age of Onset  . Coronary artery disease Sister   . Cancer Sister     breast  . Hypertension Neg Hx   . Diabetes Neg Hx     History   Social History  . Marital Status: Widowed    Spouse Name:  N/A    Number of Children: 3  . Years of Education: N/A   Occupational History  . [employer]     part time   Social History Main Topics  . Smoking status: Never Smoker   . Smokeless tobacco: Never Used  . Alcohol Use: No  . Drug Use: No  . Sexual Activity: Not on file   Other Topics Concern  . Not on file   Social History Narrative   Widowed 2011   No living will   Would want daughter Brooke Goodman to make health care decisions for her   Would want attempts at resuscitation   Probably would accept tube feeds   Review of Systems  Constitutional: Negative for fatigue.       Did lose 12# since last year Wears seat belt  HENT: Negative for dental problem, hearing loss and tinnitus.        Regular with dentist  Eyes: Positive for visual disturbance.       Floaters  Respiratory: Negative for cough, chest tightness and shortness of breath.   Cardiovascular: Positive for palpitations. Negative for chest pain and leg swelling.       Palps if too much caffeine  Gastrointestinal: Negative for nausea, vomiting, abdominal pain, constipation and blood in stool.       No heartburn  Endocrine: Negative for polydipsia and  polyuria.  Genitourinary: Positive for urgency and frequency. Negative for dysuria and hematuria.  Musculoskeletal: Positive for arthralgias. Negative for back pain and joint swelling.       Just knee pain for the most part  Skin: Positive for rash.       Has suspicious lesion on right forearm-- there for a month or so Cream helps the psoriasis  Allergic/Immunologic: Negative for environmental allergies and immunocompromised state.  Neurological: Negative for dizziness, syncope, weakness, light-headedness, numbness and headaches.  Hematological: Negative for adenopathy. Bruises/bleeds easily.  Psychiatric/Behavioral: Negative for sleep disturbance and dysphoric mood. The patient is not nervous/anxious.        Objective:   Physical Exam  Constitutional: She is  oriented to person, place, and time. She appears well-developed and well-nourished. No distress.  HENT:  Head: Normocephalic and atraumatic.  Mouth/Throat: Oropharynx is clear and moist. No oropharyngeal exudate.  Eyes: Conjunctivae are normal. No scleral icterus.  Neck: Normal range of motion. Neck supple. No thyromegaly present.  Cardiovascular: Normal rate, regular rhythm, normal heart sounds and intact distal pulses.  Exam reveals no gallop.   No murmur heard. Pulmonary/Chest: Effort normal and breath sounds normal. No respiratory distress. She has no wheezes. She has no rales.  Abdominal: Soft. There is no tenderness.  Musculoskeletal: She exhibits no edema or tenderness.  Lymphadenopathy:    She has no cervical adenopathy.  Neurological: She is alert and oriented to person, place, and time.  President-- "Obama, ?" [id number] Recall 2/3  Skin:  65mm actinic on right forearm  Psychiatric: She has a normal mood and affect. Her behavior is normal.          Assessment & Plan:

## 2014-07-23 NOTE — Assessment & Plan Note (Signed)
Liquid nitrogen 45 seconds x 2 Discussed home care

## 2014-07-23 NOTE — Addendum Note (Signed)
Addended by: Despina Hidden on: 07/23/2014 12:49 PM   Modules accepted: Orders

## 2014-07-23 NOTE — Assessment & Plan Note (Signed)
I have personally reviewed the Medicare Annual Wellness questionnaire and have noted 1. The patient's medical and social history 2. Their use of alcohol, tobacco or illicit drugs 3. Their current medications and supplements 4. The patient's functional ability including ADL's, fall risks, home safety risks and hearing or visual             impairment. 5. Diet and physical activities 6. Evidence for depression or mood disorders  The patients weight, height, BMI and visual acuity have been recorded in the chart I have made referrals, counseling and provided education to the patient based review of the above and I have provided the pt with a written personalized care plan for preventive services.  I have provided you with a copy of your personalized plan for preventive services. Please take the time to review along with your updated medication list.  Mammogram due 2017 (every 2 years for now)---prefers no exam Will do fecal immunoassay again Prevnar today Discussed fitness

## 2014-07-26 ENCOUNTER — Encounter: Payer: Self-pay | Admitting: *Deleted

## 2014-08-25 ENCOUNTER — Other Ambulatory Visit: Payer: Self-pay | Admitting: *Deleted

## 2014-08-25 MED ORDER — TRIAMTERENE-HCTZ 37.5-25 MG PO TABS: 1.0000 | ORAL_TABLET | Freq: Every day | ORAL | Status: DC

## 2015-03-08 DIAGNOSIS — M25561 Pain in right knee: Secondary | ICD-10-CM | POA: Diagnosis not present

## 2015-03-08 DIAGNOSIS — M1711 Unilateral primary osteoarthritis, right knee: Secondary | ICD-10-CM | POA: Diagnosis not present

## 2015-03-08 DIAGNOSIS — G8929 Other chronic pain: Secondary | ICD-10-CM | POA: Diagnosis not present

## 2015-04-13 ENCOUNTER — Encounter
Admission: RE | Admit: 2015-04-13 | Discharge: 2015-04-13 | Disposition: A | Discharge status: Home / Self Care | Payer: Medicare Other | Source: Ambulatory Visit | Attending: Orthopedic Surgery | Admitting: Orthopedic Surgery

## 2015-04-13 DIAGNOSIS — Z471 Aftercare following joint replacement surgery: Secondary | ICD-10-CM | POA: Diagnosis not present

## 2015-04-13 DIAGNOSIS — Z8261 Family history of arthritis: Secondary | ICD-10-CM | POA: Diagnosis not present

## 2015-04-13 DIAGNOSIS — M179 Osteoarthritis of knee, unspecified: Secondary | ICD-10-CM | POA: Diagnosis not present

## 2015-04-13 DIAGNOSIS — Z01810 Encounter for preprocedural cardiovascular examination: Secondary | ICD-10-CM | POA: Insufficient documentation

## 2015-04-13 DIAGNOSIS — Z6841 Body Mass Index (BMI) 40.0 and over, adult: Secondary | ICD-10-CM | POA: Diagnosis not present

## 2015-04-13 DIAGNOSIS — G2581 Restless legs syndrome: Secondary | ICD-10-CM | POA: Diagnosis not present

## 2015-04-13 DIAGNOSIS — I10 Essential (primary) hypertension: Secondary | ICD-10-CM | POA: Insufficient documentation

## 2015-04-13 DIAGNOSIS — Z96651 Presence of right artificial knee joint: Secondary | ICD-10-CM | POA: Diagnosis not present

## 2015-04-13 DIAGNOSIS — Z01812 Encounter for preprocedural laboratory examination: Secondary | ICD-10-CM | POA: Insufficient documentation

## 2015-04-13 DIAGNOSIS — I2510 Atherosclerotic heart disease of native coronary artery without angina pectoris: Secondary | ICD-10-CM | POA: Diagnosis not present

## 2015-04-13 DIAGNOSIS — I252 Old myocardial infarction: Secondary | ICD-10-CM | POA: Diagnosis not present

## 2015-04-13 DIAGNOSIS — L409 Psoriasis, unspecified: Secondary | ICD-10-CM | POA: Diagnosis not present

## 2015-04-13 DIAGNOSIS — Z79899 Other long term (current) drug therapy: Secondary | ICD-10-CM | POA: Diagnosis not present

## 2015-04-13 DIAGNOSIS — M25561 Pain in right knee: Secondary | ICD-10-CM | POA: Insufficient documentation

## 2015-04-13 DIAGNOSIS — E669 Obesity, unspecified: Secondary | ICD-10-CM | POA: Diagnosis not present

## 2015-04-13 DIAGNOSIS — M1711 Unilateral primary osteoarthritis, right knee: Secondary | ICD-10-CM | POA: Diagnosis not present

## 2015-04-13 DIAGNOSIS — T402X5A Adverse effect of other opioids, initial encounter: Secondary | ICD-10-CM | POA: Diagnosis not present

## 2015-04-13 DIAGNOSIS — R2689 Other abnormalities of gait and mobility: Secondary | ICD-10-CM | POA: Diagnosis not present

## 2015-04-13 DIAGNOSIS — R112 Nausea with vomiting, unspecified: Secondary | ICD-10-CM | POA: Diagnosis not present

## 2015-04-13 DIAGNOSIS — M6281 Muscle weakness (generalized): Secondary | ICD-10-CM | POA: Diagnosis not present

## 2015-04-13 HISTORY — DX: Restless legs syndrome: G25.81

## 2015-04-13 LAB — CBC
HEMATOCRIT: 42.1 % (ref 35.0–47.0)
Hemoglobin: 13.9 g/dL (ref 12.0–16.0)
MCH: 30.2 pg (ref 26.0–34.0)
MCHC: 33 g/dL (ref 32.0–36.0)
MCV: 91.5 fL (ref 80.0–100.0)
Platelets: 311 10*3/uL (ref 150–440)
RBC: 4.6 MIL/uL (ref 3.80–5.20)
RDW: 13 % (ref 11.5–14.5)
WBC: 9.8 10*3/uL (ref 3.6–11.0)

## 2015-04-13 LAB — TYPE AND SCREEN
ABO/RH(D): A POS
ANTIBODY SCREEN: NEGATIVE

## 2015-04-13 LAB — BASIC METABOLIC PANEL
Anion gap: 12 (ref 5–15)
BUN: 29 mg/dL — ABNORMAL HIGH (ref 6–20)
CO2: 27 mmol/L (ref 22–32)
CREATININE: 0.88 mg/dL (ref 0.44–1.00)
Calcium: 9.9 mg/dL (ref 8.9–10.3)
Chloride: 102 mmol/L (ref 101–111)
GFR calc Af Amer: >60 mL/min (ref >60)
GFR calc non Af Amer: >60 mL/min (ref >60)
Glucose, Bld: 106 mg/dL — ABNORMAL HIGH (ref 65–99)
Potassium: 3.8 mmol/L (ref 3.5–5.1)
Sodium: 141 mmol/L (ref 135–145)

## 2015-04-13 LAB — SURGICAL PCR SCREEN
MRSA, PCR: NEGATIVE
Staphylococcus aureus: NEGATIVE

## 2015-04-13 LAB — URINALYSIS COMPLETE WITH MICROSCOPIC (ARMC ONLY)
BILIRUBIN URINE: NEGATIVE
Glucose, UA: NEGATIVE mg/dL
Hgb urine dipstick: NEGATIVE
Leukocytes, UA: NEGATIVE
NITRITE: NEGATIVE
PH: 5 (ref 5.0–8.0)
Protein, ur: NEGATIVE mg/dL
SPECIFIC GRAVITY, URINE: 1.019 (ref 1.005–1.030)

## 2015-04-13 LAB — APTT: aPTT: 29 seconds (ref 24–36)

## 2015-04-13 LAB — SEDIMENTATION RATE: Sed Rate: 34 mm/hr — ABNORMAL HIGH (ref 0–30)

## 2015-04-13 LAB — ABO/RH: ABO/RH(D): A POS

## 2015-04-13 LAB — PROTIME-INR
INR: 1.02
Prothrombin Time: 13.6 seconds (ref 11.4–15.0)

## 2015-04-13 NOTE — Patient Instructions (Signed)
  Your procedure is scheduled on: April 27, 2015 (Wednesday) Report to Day Surgery. To find out your arrival time please call [phone] between 1PM - 3PM on April 26, 2015 (Tuesday).  Remember: Instructions that are not followed completely may result in serious medical risk, up to and including death, or upon the discretion of your surgeon and anesthesiologist your surgery may need to be rescheduled.    __x__ 1. Do not eat food or drink liquids after midnight. No gum chewing or hard candies.     ____ 2. No Alcohol for 24 hours before or after surgery.   ____ 3. Bring all medications with you on the day of surgery if instructed.    __x__ 4. Notify your doctor if there is any change in your medical condition     (cold, fever, infections).     Do not wear jewelry, make-up, hairpins, clips or nail polish.  Do not wear lotions, powders, or perfumes. You may wear deodorant.  Do not shave 48 hours prior to surgery. Men may shave face and neck.  Do not bring valuables to the hospital.    [facility] is not responsible for any belongings or valuables.               Contacts, dentures or bridgework may not be worn into surgery.  Leave your suitcase in the car. After surgery it may be brought to your room.  For patients admitted to the hospital, discharge time is determined by your                treatment team.   Patients discharged the day of surgery will not be allowed to drive home.   Please read over the following fact sheets that you were given:   Surgical Site Infection Prevention   ____ Take these medicines the morning of surgery with A SIP OF WATER:    1.   2.   3.   4.  5.  6.  ____ Fleet Enema (as directed)   __x__ Use CHG Soap as directed  ____ Use inhalers on the day of surgery  ____ Stop metformin 2 days prior to surgery    ____ Take 1/2 of usual insulin dose the night before surgery and none on the morning of surgery.   ____ Stop Coumadin/Plavix/aspirin on    ____ Stop Anti-inflammatories on    ____ Stop supplements until after surgery.    ____ Bring C-Pap to the hospital.

## 2015-04-14 NOTE — OR Nursing (Signed)
EKG OK PER DR ADAMS

## 2015-04-15 LAB — URINE CULTURE

## 2015-04-27 ENCOUNTER — Encounter: Payer: Self-pay | Admitting: *Deleted

## 2015-04-27 ENCOUNTER — Inpatient Hospital Stay: Payer: Medicare Other | Admitting: Certified Registered"

## 2015-04-27 ENCOUNTER — Encounter
Admission: AD | Disposition: A | Discharge status: Skilled Nursing Facility | Payer: Self-pay | Source: Ambulatory Visit | Attending: Orthopedic Surgery

## 2015-04-27 ENCOUNTER — Inpatient Hospital Stay: Payer: Medicare Other

## 2015-04-27 ENCOUNTER — Inpatient Hospital Stay
Admission: AD | Admit: 2015-04-27 | Discharge: 2015-04-30 | DRG: 470 | Disposition: A | Discharge status: Skilled Nursing Facility | Payer: Medicare Other | Source: Ambulatory Visit | Attending: Orthopedic Surgery | Admitting: Orthopedic Surgery

## 2015-04-27 DIAGNOSIS — I10 Essential (primary) hypertension: Secondary | ICD-10-CM | POA: Diagnosis not present

## 2015-04-27 DIAGNOSIS — G2581 Restless legs syndrome: Secondary | ICD-10-CM | POA: Diagnosis present

## 2015-04-27 DIAGNOSIS — Z8261 Family history of arthritis: Secondary | ICD-10-CM | POA: Diagnosis not present

## 2015-04-27 DIAGNOSIS — R112 Nausea with vomiting, unspecified: Secondary | ICD-10-CM | POA: Diagnosis not present

## 2015-04-27 DIAGNOSIS — M179 Osteoarthritis of knee, unspecified: Principal | ICD-10-CM | POA: Diagnosis present

## 2015-04-27 DIAGNOSIS — Z96659 Presence of unspecified artificial knee joint: Secondary | ICD-10-CM

## 2015-04-27 DIAGNOSIS — Z6841 Body Mass Index (BMI) 40.0 and over, adult: Secondary | ICD-10-CM | POA: Diagnosis not present

## 2015-04-27 DIAGNOSIS — T402X5A Adverse effect of other opioids, initial encounter: Secondary | ICD-10-CM | POA: Diagnosis not present

## 2015-04-27 DIAGNOSIS — E669 Obesity, unspecified: Secondary | ICD-10-CM | POA: Diagnosis present

## 2015-04-27 DIAGNOSIS — Z79899 Other long term (current) drug therapy: Secondary | ICD-10-CM | POA: Diagnosis not present

## 2015-04-27 DIAGNOSIS — L409 Psoriasis, unspecified: Secondary | ICD-10-CM | POA: Diagnosis present

## 2015-04-27 HISTORY — PX: TOTAL KNEE ARTHROPLASTY: SHX125

## 2015-04-27 SURGERY — ARTHROPLASTY, KNEE, TOTAL
Anesthesia: Monitor Anesthesia Care | Site: Knee | Laterality: Right | Wound class: Clean

## 2015-04-27 MED ORDER — ACETAMINOPHEN 10 MG/ML IV SOLN
INTRAVENOUS | Status: DC | PRN
  Administered 2015-04-27: 1000 mg via INTRAVENOUS

## 2015-04-27 MED ORDER — ACETAMINOPHEN 650 MG RE SUPP: 650.0000 mg | Freq: Four times a day (QID) | RECTAL | Status: DC | PRN

## 2015-04-27 MED ORDER — TRIAMTERENE-HCTZ 37.5-25 MG PO TABS
1.0000 | ORAL_TABLET | Freq: Every day | ORAL | Status: DC
  Administered 2015-04-28 – 2015-04-30 (×3): 1 via ORAL
  Filled 2015-04-27 (×3): qty 1

## 2015-04-27 MED ORDER — ACETAMINOPHEN 10 MG/ML IV SOLN
INTRAVENOUS | Status: AC
  Filled 2015-04-27: qty 100

## 2015-04-27 MED ORDER — TETRACAINE HCL 1 % IJ SOLN
INTRAMUSCULAR | Status: AC
  Filled 2015-04-27: qty 2

## 2015-04-27 MED ORDER — TRANEXAMIC ACID 1000 MG/10ML IV SOLN
INTRAVENOUS | Status: AC
  Filled 2015-04-27: qty 10

## 2015-04-27 MED ORDER — FLEET ENEMA 7-19 GM/118ML RE ENEM
1.0000 | ENEMA | Freq: Once | RECTAL | Status: DC | PRN
Start: 2015-04-27 — End: 2015-04-30

## 2015-04-27 MED ORDER — LACTATED RINGERS IV SOLN
INTRAVENOUS | Status: DC
  Administered 2015-04-27 (×2): via INTRAVENOUS

## 2015-04-27 MED ORDER — CEFAZOLIN SODIUM-DEXTROSE 2-3 GM-% IV SOLR
INTRAVENOUS | Status: AC
  Filled 2015-04-27: qty 50

## 2015-04-27 MED ORDER — PROPOFOL INFUSION 10 MG/ML OPTIME
INTRAVENOUS | Status: DC | PRN
  Administered 2015-04-27: 25 ug/kg/min via INTRAVENOUS
  Administered 2015-04-27: 35 ug/kg/min via INTRAVENOUS
  Administered 2015-04-27: 10:00:00 via INTRAVENOUS

## 2015-04-27 MED ORDER — ALUM & MAG HYDROXIDE-SIMETH 200-200-20 MG/5ML PO SUSP: 30.0000 mL | ORAL | Status: DC | PRN

## 2015-04-27 MED ORDER — BUPIVACAINE LIPOSOME 1.3 % IJ SUSP
INTRAMUSCULAR | Status: AC
  Filled 2015-04-27: qty 20

## 2015-04-27 MED ORDER — ADULT MULTIVITAMIN W/MINERALS CH
1.0000 | ORAL_TABLET | Freq: Every day | ORAL | Status: DC
  Administered 2015-04-28 – 2015-04-30 (×3): 1 via ORAL
  Filled 2015-04-27 (×3): qty 1

## 2015-04-27 MED ORDER — BUPIVACAINE-EPINEPHRINE 0.25% -1:200000 IJ SOLN
INTRAMUSCULAR | Status: DC | PRN
  Administered 2015-04-27: 30 mL

## 2015-04-27 MED ORDER — METOCLOPRAMIDE HCL 10 MG PO TABS
10.0000 mg | ORAL_TABLET | Freq: Three times a day (TID) | ORAL | Status: AC
  Administered 2015-04-27 – 2015-04-29 (×7): 10 mg via ORAL
  Filled 2015-04-27 (×8): qty 1

## 2015-04-27 MED ORDER — TRAMADOL HCL 50 MG PO TABS
50.0000 mg | ORAL_TABLET | ORAL | Status: DC | PRN
  Administered 2015-04-28 (×2): 100 mg via ORAL
  Administered 2015-04-28: 50 mg via ORAL
  Administered 2015-04-28 (×2): 100 mg via ORAL
  Filled 2015-04-27 (×3): qty 2
  Filled 2015-04-27: qty 1
  Filled 2015-04-27: qty 2

## 2015-04-27 MED ORDER — NEOMYCIN-POLYMYXIN B GU 40-200000 IR SOLN
Status: DC | PRN
  Administered 2015-04-27: 14 mL

## 2015-04-27 MED ORDER — FENTANYL CITRATE (PF) 100 MCG/2ML IJ SOLN: 25.0000 ug | INTRAMUSCULAR | Status: DC | PRN

## 2015-04-27 MED ORDER — OXYCODONE HCL 5 MG PO TABS
5.0000 mg | ORAL_TABLET | ORAL | Status: DC | PRN
  Administered 2015-04-27 (×2): 10 mg via ORAL
  Administered 2015-04-27: 5 mg via ORAL
  Administered 2015-04-28: 10 mg via ORAL
  Filled 2015-04-27: qty 1
  Filled 2015-04-27 (×3): qty 2

## 2015-04-27 MED ORDER — MORPHINE SULFATE (PF) 2 MG/ML IV SOLN
2.0000 mg | INTRAVENOUS | Status: DC | PRN
  Administered 2015-04-27 (×2): 2 mg via INTRAVENOUS
  Filled 2015-04-27 (×2): qty 1

## 2015-04-27 MED ORDER — BISACODYL 10 MG RE SUPP: 10.0000 mg | Freq: Every day | RECTAL | Status: DC | PRN

## 2015-04-27 MED ORDER — TRANEXAMIC ACID 1000 MG/10ML IV SOLN
1000.0000 mg | Freq: Once | INTRAVENOUS | Status: AC
  Administered 2015-04-27: 1000 mg via INTRAVENOUS
  Filled 2015-04-27 (×2): qty 10

## 2015-04-27 MED ORDER — SODIUM CHLORIDE 0.9 % IJ SOLN
INTRAMUSCULAR | Status: AC
  Administered 2015-04-27: 07:00:00
  Filled 2015-04-27: qty 3

## 2015-04-27 MED ORDER — CLOBETASOL PROPIONATE 0.05 % EX CREA: 1.0000 "application " | TOPICAL_CREAM | Freq: Two times a day (BID) | CUTANEOUS | Status: DC | PRN

## 2015-04-27 MED ORDER — SODIUM CHLORIDE 0.9 % IV SOLN
INTRAVENOUS | Status: DC | PRN
  Administered 2015-04-27: 60 mL

## 2015-04-27 MED ORDER — FERROUS SULFATE 325 (65 FE) MG PO TABS
325.0000 mg | ORAL_TABLET | Freq: Two times a day (BID) | ORAL | Status: DC
Start: 2015-04-27 — End: 2015-04-30
  Administered 2015-04-27 – 2015-04-30 (×6): 325 mg via ORAL
  Filled 2015-04-27 (×6): qty 1

## 2015-04-27 MED ORDER — VITAMIN D 1000 UNITS PO TABS
1000.0000 [IU] | ORAL_TABLET | Freq: Every day | ORAL | Status: DC
  Administered 2015-04-28 – 2015-04-30 (×3): 1000 [IU] via ORAL
  Filled 2015-04-27 (×3): qty 1

## 2015-04-27 MED ORDER — ONDANSETRON HCL 4 MG/2ML IJ SOLN: 4.0000 mg | Freq: Once | INTRAMUSCULAR | Status: DC | PRN

## 2015-04-27 MED ORDER — ACETAMINOPHEN 325 MG PO TABS: 650.0000 mg | ORAL_TABLET | Freq: Four times a day (QID) | ORAL | Status: DC | PRN

## 2015-04-27 MED ORDER — FAMOTIDINE 20 MG PO TABS
ORAL_TABLET | ORAL | Status: AC
  Administered 2015-04-27: 20 mg via ORAL
  Filled 2015-04-27: qty 1

## 2015-04-27 MED ORDER — ONDANSETRON HCL 4 MG PO TABS: 4.0000 mg | ORAL_TABLET | Freq: Four times a day (QID) | ORAL | Status: DC | PRN

## 2015-04-27 MED ORDER — CEFAZOLIN SODIUM-DEXTROSE 2-3 GM-% IV SOLR
2.0000 g | Freq: Once | INTRAVENOUS | Status: AC
  Administered 2015-04-27: 2 g via INTRAVENOUS

## 2015-04-27 MED ORDER — DIPHENHYDRAMINE HCL 12.5 MG/5ML PO ELIX: 12.5000 mg | ORAL_SOLUTION | ORAL | Status: DC | PRN

## 2015-04-27 MED ORDER — SENNOSIDES-DOCUSATE SODIUM 8.6-50 MG PO TABS
1.0000 | ORAL_TABLET | Freq: Two times a day (BID) | ORAL | Status: DC
  Administered 2015-04-27 – 2015-04-30 (×6): 1 via ORAL
  Filled 2015-04-27 (×6): qty 1

## 2015-04-27 MED ORDER — FAMOTIDINE 20 MG PO TABS
20.0000 mg | ORAL_TABLET | Freq: Once | ORAL | Status: AC
  Administered 2015-04-27: 20 mg via ORAL

## 2015-04-27 MED ORDER — PHENOL 1.4 % MT LIQD: 1.0000 | OROMUCOSAL | Status: DC | PRN

## 2015-04-27 MED ORDER — SODIUM CHLORIDE 0.9 % IJ SOLN
INTRAMUSCULAR | Status: AC
  Filled 2015-04-27: qty 10

## 2015-04-27 MED ORDER — CEFAZOLIN SODIUM-DEXTROSE 2-3 GM-% IV SOLR
2.0000 g | Freq: Four times a day (QID) | INTRAVENOUS | Status: AC
  Administered 2015-04-27 – 2015-04-28 (×4): 2 g via INTRAVENOUS
  Filled 2015-04-27 (×4): qty 50

## 2015-04-27 MED ORDER — MIDAZOLAM HCL 5 MG/5ML IJ SOLN
INTRAMUSCULAR | Status: DC | PRN
  Administered 2015-04-27 (×2): 1 mg via INTRAVENOUS

## 2015-04-27 MED ORDER — FENTANYL CITRATE (PF) 100 MCG/2ML IJ SOLN
INTRAMUSCULAR | Status: DC | PRN
  Administered 2015-04-27 (×2): 50 ug via INTRAVENOUS

## 2015-04-27 MED ORDER — SODIUM CHLORIDE 0.9 % IJ SOLN
INTRAMUSCULAR | Status: AC
  Filled 2015-04-27: qty 50

## 2015-04-27 MED ORDER — ENOXAPARIN SODIUM 30 MG/0.3ML ~~LOC~~ SOLN
30.0000 mg | Freq: Two times a day (BID) | SUBCUTANEOUS | Status: DC
  Administered 2015-04-28 – 2015-04-30 (×5): 30 mg via SUBCUTANEOUS
  Filled 2015-04-27 (×5): qty 0.3

## 2015-04-27 MED ORDER — SODIUM CHLORIDE 0.9 % IV SOLN
INTRAVENOUS | Status: DC
  Administered 2015-04-27 – 2015-04-28 (×2): via INTRAVENOUS

## 2015-04-27 MED ORDER — LIDOCAINE HCL (PF) 2 % IJ SOLN
INTRAMUSCULAR | Status: DC | PRN
  Administered 2015-04-27: 50 mg

## 2015-04-27 MED ORDER — ACETAMINOPHEN 10 MG/ML IV SOLN
1000.0000 mg | Freq: Four times a day (QID) | INTRAVENOUS | Status: AC
  Administered 2015-04-27 – 2015-04-28 (×4): 1000 mg via INTRAVENOUS
  Filled 2015-04-27 (×4): qty 100

## 2015-04-27 MED ORDER — BUPIVACAINE HCL (PF) 0.5 % IJ SOLN
INTRAMUSCULAR | Status: DC | PRN
  Administered 2015-04-27: 2 mL via INTRATHECAL

## 2015-04-27 MED ORDER — BUPIVACAINE-EPINEPHRINE (PF) 0.25% -1:200000 IJ SOLN
INTRAMUSCULAR | Status: AC
  Filled 2015-04-27: qty 30

## 2015-04-27 MED ORDER — ONDANSETRON HCL 4 MG/2ML IJ SOLN
4.0000 mg | Freq: Four times a day (QID) | INTRAMUSCULAR | Status: DC | PRN
  Administered 2015-04-28: 4 mg via INTRAVENOUS
  Filled 2015-04-27: qty 2

## 2015-04-27 MED ORDER — MENTHOL 3 MG MT LOZG: 1.0000 | LOZENGE | OROMUCOSAL | Status: DC | PRN

## 2015-04-27 MED ORDER — PANTOPRAZOLE SODIUM 40 MG PO TBEC
40.0000 mg | DELAYED_RELEASE_TABLET | Freq: Two times a day (BID) | ORAL | Status: DC
  Administered 2015-04-27 – 2015-04-30 (×7): 40 mg via ORAL
  Filled 2015-04-27 (×7): qty 1

## 2015-04-27 MED ORDER — TRANEXAMIC ACID 1000 MG/10ML IV SOLN
1000.0000 mg | INTRAVENOUS | Status: DC | PRN
  Administered 2015-04-27: 1000 mg via INTRAVENOUS

## 2015-04-27 MED ORDER — MAGNESIUM HYDROXIDE 400 MG/5ML PO SUSP
30.0000 mL | Freq: Every day | ORAL | Status: DC | PRN
  Administered 2015-04-29: 30 mL via ORAL
  Filled 2015-04-27: qty 30

## 2015-04-27 MED ORDER — TETRACAINE HCL 1 % IJ SOLN
INTRAMUSCULAR | Status: DC | PRN
  Administered 2015-04-27: 8 mg via INTRASPINAL

## 2015-04-27 SURGICAL SUPPLY — 58 items
AUTOTRANSFUS HAS 1/8 (MISCELLANEOUS) ×3
BATTERY INSTRU NAVIGATION (MISCELLANEOUS) ×12 IMPLANT
BLADE SAW 1 (BLADE) ×3 IMPLANT
BLADE SAW 1/2 (BLADE) ×3 IMPLANT
BONE CEMENT GENTAMICIN (Cement) ×6 IMPLANT
BTRY SRG DRVR LF (MISCELLANEOUS) ×4
CANISTER SUCT 1200ML W/VALVE (MISCELLANEOUS) ×3 IMPLANT
CANISTER SUCT 3000ML (MISCELLANEOUS) ×6 IMPLANT
CAP KNEE TOTAL 3 SIGMA ×2 IMPLANT
CATH TRAY METER 16FR LF (MISCELLANEOUS) ×3 IMPLANT
CEMENT BONE GENTAMICIN 40 (Cement) IMPLANT
COOLER POLAR GLACIER W/PUMP (MISCELLANEOUS) ×3 IMPLANT
DRAPE SHEET LG 3/4 BI-LAMINATE (DRAPES) ×3 IMPLANT
DRSG DERMACEA 8X12 NADH (GAUZE/BANDAGES/DRESSINGS) ×3 IMPLANT
DRSG OPSITE POSTOP 4X14 (GAUZE/BANDAGES/DRESSINGS) ×3 IMPLANT
DURAPREP 26ML APPLICATOR (WOUND CARE) ×4 IMPLANT
ELECT CAUTERY BLADE 6.4 (BLADE) ×3 IMPLANT
EX-PIN ORTHOLOCK NAV 4X150 (PIN) ×6 IMPLANT
GLOVE BIOGEL M STRL SZ7.5 (GLOVE) ×6 IMPLANT
GLOVE INDICATOR 8.0 STRL GRN (GLOVE) ×3 IMPLANT
GLOVE SURG 9.0 ORTHO LTXF (GLOVE) ×3 IMPLANT
GLOVE SURG ORTHO 9.0 STRL STRW (GLOVE) ×3 IMPLANT
GOWN STRL REUS W/ TWL LRG LVL3 (GOWN DISPOSABLE) ×1 IMPLANT
GOWN STRL REUS W/TWL 2XL LVL3 (GOWN DISPOSABLE) ×3 IMPLANT
GOWN STRL REUS W/TWL LRG LVL3 (GOWN DISPOSABLE) ×3
GOWN STRL REUS W/TWL XL LVL4 (GOWN DISPOSABLE) ×3 IMPLANT
HANDPIECE SUCTION TUBG SURGILV (MISCELLANEOUS) ×3 IMPLANT
HOLDER FOLEY CATH W/STRAP (MISCELLANEOUS) ×3 IMPLANT
HOOD PEEL AWAY FACE SHEILD DIS (HOOD) ×6 IMPLANT
KNIFE SCULPS 14X20 (INSTRUMENTS) ×3 IMPLANT
NDL SAFETY 18GX1.5 (NEEDLE) ×3 IMPLANT
NDL SPNL 20GX3.5 QUINCKE YW (NEEDLE) ×1 IMPLANT
NEEDLE SPNL 20GX3.5 QUINCKE YW (NEEDLE) ×3 IMPLANT
NS IRRIG 500ML POUR BTL (IV SOLUTION) ×3 IMPLANT
PACK TOTAL KNEE (MISCELLANEOUS) ×3 IMPLANT
PAD GROUND ADULT SPLIT (MISCELLANEOUS) ×3 IMPLANT
PAD WRAPON POLAR KNEE (MISCELLANEOUS) ×1 IMPLANT
PIN FIXATION 1/8DIA X 3INL (PIN) ×3 IMPLANT
SOL .9 NS 3000ML IRR  AL (IV SOLUTION) ×2
SOL .9 NS 3000ML IRR AL (IV SOLUTION) ×1
SOL .9 NS 3000ML IRR UROMATIC (IV SOLUTION) ×1 IMPLANT
SOL PREP PVP 2OZ (MISCELLANEOUS) ×3
SOLUTION PREP PVP 2OZ (MISCELLANEOUS) ×1 IMPLANT
SPONGE DRAIN TRACH 4X4 STRL 2S (GAUZE/BANDAGES/DRESSINGS) ×3 IMPLANT
STAPLER SKIN PROX 35W (STAPLE) ×3 IMPLANT
STRAP SAFETY BODY (MISCELLANEOUS) ×3 IMPLANT
SUCTION FRAZIER TIP 10 FR DISP (SUCTIONS) ×3 IMPLANT
SUT VIC AB 0 CT1 36 (SUTURE) ×3 IMPLANT
SUT VIC AB 1 CT1 36 (SUTURE) ×6 IMPLANT
SUT VIC AB 2-0 CT2 27 (SUTURE) ×3 IMPLANT
SYR 20CC LL (SYRINGE) ×3 IMPLANT
SYR 30ML LL (SYRINGE) ×3 IMPLANT
SYR 50ML LL SCALE MARK (SYRINGE) ×3 IMPLANT
SYSTEM AUTOTRANSFUS DUAL TROCR (MISCELLANEOUS) ×1 IMPLANT
TOWEL OR 17X26 4PK STRL BLUE (TOWEL DISPOSABLE) ×3 IMPLANT
TOWER CARTRIDGE SMART MIX (DISPOSABLE) ×3 IMPLANT
WATER STERILE IRR 1000ML POUR (IV SOLUTION) ×3 IMPLANT
WRAPON POLAR PAD KNEE (MISCELLANEOUS) ×3

## 2015-04-27 NOTE — Progress Notes (Signed)
Xray right knee completed.

## 2015-04-27 NOTE — Progress Notes (Signed)
Polar care to right leg. Towel rolls to lower legs, feet off bed.

## 2015-04-27 NOTE — Brief Op Note (Signed)
04/27/2015  11:04 AM  PATIENT:  Brooke Goodman  74 y.o. female  PRE-OPERATIVE DIAGNOSIS:  DEGERNERATIVE ARTHROSIS RIGHT KNEE  POST-OPERATIVE DIAGNOSIS:  DEGERNERATIVE ARTHROSIS RIGHT KNEE  PROCEDURE:  Procedure(s): TOTAL KNEE ARTHROPLASTY (Right) using computer-assisted navigation  SURGEON:  Surgeon(s) and Role:    * Dereck Leep, MD - Primary  ASSISTANTS: Vance Peper, PA    ANESTHESIA:   spinal  EBL:  Total I/O In: 1000 [I.V.:1000] Out: 275 [Urine:200; Blood:75]  BLOOD ADMINISTERED:none  DRAINS: 2 medium drains to reinfusion system   LOCAL MEDICATIONS USED:  MARCAINE    and OTHER exparel  SPECIMEN:  No Specimen  DISPOSITION OF SPECIMEN:  N/A  COUNTS:  YES  TOURNIQUET:  103 min  DICTATION: .Dragon Dictation  PLAN OF CARE: Admit to inpatient   PATIENT DISPOSITION:  PACU - hemodynamically stable.   Delay start of Pharmacological VTE agent (>24hrs) due to surgical blood loss or risk of bleeding: yes

## 2015-04-27 NOTE — H&P (Signed)
The patient has been re-examined, and the chart reviewed, and there have been no interval changes to the documented history and physical.    The risks, benefits, and alternatives have been discussed at length. The patient expressed understanding of the risks benefits and agreed with plans for surgical intervention.  Danaysha Kirn P. Quinnton Bury, Jr. M.D.    

## 2015-04-27 NOTE — Anesthesia Preprocedure Evaluation (Addendum)
Anesthesia Evaluation  Patient identified by MRN, date of birth, ID band Patient awake    Reviewed: Allergy & Precautions, H&P , NPO status , Patient's Chart, lab work & pertinent test results, reviewed documented beta blocker date and time   History of Anesthesia Complications Negative for: history of anesthetic complications  Airway Mallampati: II  TM Distance: >3 FB Neck ROM: full    Dental no notable dental hx. (+) Edentulous Upper, Edentulous Lower, Upper Dentures, Lower Dentures   Pulmonary neg pulmonary ROS,  breath sounds clear to auscultation  Pulmonary exam normal       Cardiovascular Exercise Tolerance: Good hypertension, - angina- CAD, - Past MI and - CABG Normal cardiovascular exam- dysrhythmias Rhythm:regular Rate:Normal     Neuro/Psych negative neurological ROS  negative psych ROS   GI/Hepatic negative GI ROS, Neg liver ROS,   Endo/Other  neg diabetesMorbid obesity  Renal/GU negative Renal ROS  negative genitourinary   Musculoskeletal   Abdominal   Peds  Hematology negative hematology ROS (+)   Anesthesia Other Findings Past Medical History:   Diverticulitis                                               Hypertension                                                 Psoriasis                                                    Urinary incontinence                                         Osteoarthritis                                               Restless leg syndrome                                        Reproductive/Obstetrics negative OB ROS                           Anesthesia Physical Anesthesia Plan  ASA: III  Anesthesia Plan: Spinal and MAC   Post-op Pain Management:    Induction:   Airway Management Planned:   Additional Equipment:   Intra-op Plan:   Post-operative Plan:   Informed Consent: I have reviewed the patients History and Physical, chart, labs  and discussed the procedure including the risks, benefits and alternatives for the proposed anesthesia with the patient or authorized representative who has indicated his/her understanding and acceptance.   Dental Advisory Given  Plan Discussed with: Anesthesiologist, CRNA and Surgeon  Anesthesia Plan Comments:        Anesthesia  Quick Evaluation

## 2015-04-27 NOTE — Anesthesia Procedure Notes (Signed)
Spinal Patient location during procedure: OR Start time: 04/27/2015 7:22 AM End time: 04/27/2015 7:27 AM Staffing Anesthesiologist: Martha Clan Resident/CRNA: Rolla Plate Performed by: resident/CRNA  Preanesthetic Checklist Completed: patient identified, site marked, surgical consent, pre-op evaluation, timeout performed, IV checked, risks and benefits discussed and monitors and equipment checked Spinal Block Patient position: sitting Prep: Betadine and site prepped and draped Patient monitoring: heart rate, continuous pulse ox, blood pressure and cardiac monitor Approach: midline Location: L4-5 Injection technique: single-shot Needle Needle type: Whitacre and Introducer  Needle gauge: 24 G Needle length: 9 cm Additional Notes Negative paresthesia. Negative blood return. Positive free-flowing CSF. Expiration date of kit checked and confirmed. Patient tolerated procedure well, without complications.

## 2015-04-27 NOTE — Transfer of Care (Signed)
Immediate Anesthesia Transfer of Care Note  Patient: Brooke Goodman  Procedure(s) Performed: Procedure(s): TOTAL KNEE ARTHROPLASTY (Right)  Patient Location: PACU  Anesthesia Type:MAC and Spinal  Level of Consciousness: awake  Airway & Oxygen Therapy: Patient Spontanous Breathing and Patient connected to face mask oxygen  Post-op Assessment: Report given to RN  Post vital signs: Reviewed  Last Vitals:  Filed Vitals:   04/27/15 1101  BP: 101/52  Pulse: 61  Temp: 37.1 C  Resp: 13    Complications: No apparent anesthesia complications

## 2015-04-27 NOTE — Op Note (Signed)
OPERATIVE NOTE  DATE OF SURGERY:  04/27/2015  PATIENT NAME:  Brooke Goodman   DOB: [birth year]  MRN: [id number]  PRE-OPERATIVE DIAGNOSIS: Degenerative arthrosis of the right knee, primary  POST-OPERATIVE DIAGNOSIS:  Same  PROCEDURE:  Right total knee arthroplasty using computer-assisted navigation  SURGEON:  Marciano Sequin. M.D.  ASSISTANT:  Vance Peper, PA (present and scrubbed throughout the case, critical for assistance with exposure, retraction, instrumentation, and closure)  ANESTHESIA: spinal  ESTIMATED BLOOD LOSS: 75 mL  FLUIDS REPLACED: 1800 mL of crystalloid  TOURNIQUET TIME: 103 minutes  DRAINS: 2 medium drains to a reinfusion system  SOFT TISSUE RELEASES: Anterior cruciate ligament, posterior cruciate ligament, deep medial collateral ligament, patellofemoral ligament, posterolateral corner  IMPLANTS UTILIZED: DePuy PFC Sigma size 4N (narrow) posterior stabilized femoral component (cemented), size 4 MBT tibial component (cemented), 35 mm 3 peg oval dome patella (cemented), and a 12.5 mm stabilized rotating platform polyethylene insert.  INDICATIONS FOR SURGERY: BRADY Goodman is a 74 y.o. year old female with a long history of progressive knee pain. X-rays demonstrated severe degenerative changes in tricompartmental fashion. The patient had not seen any significant improvement despite conservative nonsurgical intervention. After discussion of the risks and benefits of surgical intervention, the patient expressed understanding of the risks benefits and agree with plans for total knee arthroplasty.   The risks, benefits, and alternatives were discussed at length including but not limited to the risks of infection, bleeding, nerve injury, stiffness, blood clots, the need for revision surgery, cardiopulmonary complications, among others, and they were willing to proceed.  PROCEDURE IN DETAIL: The patient was brought into the operating room and, after adequate spinal  anesthesia was achieved, a tourniquet was placed on the patient's upper thigh. The patient's knee and leg were cleaned and prepped with alcohol and DuraPrep and draped in the usual sterile fashion. A "timeout" was performed as per usual protocol. The lower extremity was exsanguinated using an Esmarch, and the tourniquet was inflated to 300 mmHg. An anterior longitudinal incision was made followed by a standard mid vastus approach. The deep fibers of the medial collateral ligament were elevated in a subperiosteal fashion off of the medial flare of the tibia so as to maintain a continuous soft tissue sleeve. The patella was subluxed laterally and the patellofemoral ligament was incised. Inspection of the knee demonstrated severe degenerative changes with full-thickness loss of articular cartilage. Osteophytes were debrided using a rongeur. Anterior and posterior cruciate ligaments were excised. Two 4.0 mm Schanz pins were inserted in the femur and into the tibia for attachment of the array of trackers used for computer-assisted navigation. Hip center was identified using a circumduction technique. Distal landmarks were mapped using the computer. The distal femur and proximal tibia were mapped using the computer. The distal femoral cutting guide was positioned using computer-assisted navigation so as to achieve a 5 distal valgus cut. The femur was sized and it was felt that a size 4N (narrow) femoral component was appropriate. A size 4 femoral cutting guide was positioned and the anterior cut was performed and verified using the computer. This was followed by completion of the posterior and chamfer cuts. Femoral cutting guide for the central box was then positioned in the center box cut was performed.  Attention was then directed to the proximal tibia. Medial and lateral menisci were excised. The extramedullary tibial cutting guide was positioned using computer-assisted navigation so as to achieve a 0 varus-valgus  alignment and 0 posterior slope. The cut was  performed and verified using the computer. The proximal tibia was sized and it was felt that a size 4 tibial tray was appropriate. Tibial and femoral trials were inserted followed by insertion of a 10 mm polyethylene insert. The knee was felt to be tight laterally. The trial implants were removed and the knee was placed in extension and distracted using Moreland retractors. The posterolateral corner was carefully released using electrocautery and Metzenbaum scissors. Trial implants were reinserted and a 12.5 mm polyethylene trial was placed.This allowed for excellent mediolateral soft tissue balancing both in flexion and in full extension. Finally, the patella was cut and prepared so as to accommodate a 35 mm 3 peg oval dome patella. A patella trial was placed and the knee was placed through a range of motion with excellent patellar tracking appreciated. The femoral trial was removed after debridement of posterior osteophytes. The central post-hole for the tibial component was reamed followed by insertion of a keel punch. Tibial trials were then removed. Cut surfaces of bone were irrigated with copious amounts of normal saline with antibiotic solution using pulsatile lavage and then suctioned dry. Polymethylmethacrylate cement with gentamicin was prepared in the usual fashion using a vacuum mixer. Cement was applied to the cut surface of the proximal tibia as well as along the undersurface of a size 4 MBT tibial component. Tibial component was positioned and impacted into place. Excess cement was removed using [employer] fast streamer. Cement was then applied to the cut surfaces of the femur as well as along the posterior flanges of the size 4N (narrow) femoral component. The femoral component was positioned and impacted into place. Excess cement was removed using [employer] fast streamer. A 12.5 mm polyethylene trial was inserted and the knee was brought into full extension with steady  axial compression applied. Finally, cement was applied to the backside of a 35 mm 3 peg oval dome patella and the patellar component was positioned and patellar clamp applied. Excess cement was removed using [employer] fast streamer. After adequate curing of the cement, the tourniquet was deflated after a total tourniquet time of 103 minutes. Hemostasis was achieved using electrocautery. The knee was irrigated with copious amounts of normal saline with antibiotic solution using pulsatile lavage and then suctioned dry. 20 mL of 1.3% Exparel in 40 mL of normal saline was injected along the posterior capsule, medial and lateral gutters, and along the arthrotomy site. A 12.5 mm stabilized rotating platform polyethylene insert was inserted and the knee was placed through a range of motion with excellent mediolateral soft tissue balancing appreciated and excellent patellar tracking noted. 2 medium drains were placed in the wound bed and brought out through separate stab incisions to be attached to a reinfusion system. The medial parapatellar portion of the incision was reapproximated using interrupted sutures of #1 Vicryl. Subcutaneous tissue was then injected with a total of 30 cc of 0.25% Marcaine with epinephrine. Subcutaneous tissue was approximated in layers using first #0 Vicryl followed #2-0 Vicryl. The skin was approximated with skin staples. A sterile dressing was applied.  The patient tolerated the procedure well and was transported to the recovery room in stable condition.    James P. Holley Bouche., M.D.

## 2015-04-28 ENCOUNTER — Encounter
Admission: RE | Admit: 2015-04-28 | Discharge: 2015-04-28 | Disposition: A | Discharge status: Home / Self Care | Payer: Medicare Other | Source: Ambulatory Visit | Attending: Internal Medicine | Admitting: Internal Medicine

## 2015-04-28 ENCOUNTER — Encounter: Payer: Self-pay | Admitting: Orthopedic Surgery

## 2015-04-28 LAB — BASIC METABOLIC PANEL
Anion gap: 8 (ref 5–15)
BUN: 21 mg/dL — AB (ref 6–20)
CALCIUM: 8.2 mg/dL — AB (ref 8.9–10.3)
CHLORIDE: 103 mmol/L (ref 101–111)
CO2: 26 mmol/L (ref 22–32)
CREATININE: 0.84 mg/dL (ref 0.44–1.00)
GFR calc non Af Amer: >60 mL/min (ref >60)
Glucose, Bld: 114 mg/dL — ABNORMAL HIGH (ref 65–99)
Potassium: 3.7 mmol/L (ref 3.5–5.1)
SODIUM: 137 mmol/L (ref 135–145)

## 2015-04-28 LAB — CBC
HCT: 31.9 % — ABNORMAL LOW (ref 35.0–47.0)
Hemoglobin: 10.5 g/dL — ABNORMAL LOW (ref 12.0–16.0)
MCH: 30.4 pg (ref 26.0–34.0)
MCHC: 32.9 g/dL (ref 32.0–36.0)
MCV: 92.4 fL (ref 80.0–100.0)
Platelets: 219 10*3/uL (ref 150–440)
RBC: 3.46 MIL/uL — ABNORMAL LOW (ref 3.80–5.20)
RDW: 12.8 % (ref 11.5–14.5)
WBC: 9.9 10*3/uL (ref 3.6–11.0)

## 2015-04-28 NOTE — Clinical Documentation Improvement (Signed)
Orthopedic  Please provide any diagnosis/condition associated with a BMI of 40.75.    Morbid Obesity  Obesity  Overweight  Other  Clinically Undetermined  Supporting Information:   Patient is 5'3" tall weighing 230 pounds   Please exercise your independent, professional judgment when responding. A specific answer is not anticipated or expected.   Thank You,  Brooke Goodman

## 2015-04-28 NOTE — Progress Notes (Signed)
   Subjective: 1 Day Post-Op Procedure(s) (LRB): TOTAL KNEE ARTHROPLASTY (Right) Patient reports pain as 4 on 0-10 scale.   Patient is well, and has had no acute complaints or problems We will start therapy today.  Plan is to go Rehab after hospital stay. no nausea and no vomiting Patient denies any chest pains or shortness of breath. Patient ate a regular diet last evening Objective: Vital signs in last 24 hours: Temp:  [97.3 F (36.3 C)-98.8 F (37.1 C)] 98 F (36.7 C) (08/18 0318) Pulse Rate:  [50-93] 58 (08/18 0318) Resp:  [11-22] 20 (08/18 0318) BP: (93-138)/(47-111) 101/49 mmHg (08/18 0318) SpO2:  [98 %-100 %] 98 % (08/18 0318) Patient has original dressing in place and unable to evaluate dressing is clean and dry. Portal care on right knee Heels are non tender and elevated off the bed using rolled towels Intake/Output from previous day: 08/17 0701 - 08/18 0700 In: 3531.7 [P.O.:480; I.V.:2901.7; IV Piggyback:150] Out: 970 [Urine:750; Drains:145; Blood:75] Intake/Output this shift: Total I/O In: 1801.7 [I.V.:1801.7] Out: 420 [Urine:350; Drains:70]   Recent Labs  04/28/15 0450  HGB 10.5*    Recent Labs  04/28/15 0450  WBC 9.9  RBC 3.46*  HCT 31.9*  PLT 219    Recent Labs  04/28/15 0450  NA 137  K 3.7  CL 103  CO2 26  BUN 21*  CREATININE 0.84  GLUCOSE 114*  CALCIUM 8.2*   No results for input(s): LABPT, INR in the last 72 hours.  EXAM General - Patient is Alert, Appropriate and Oriented Extremity - Neurologically intact Neurovascular intact Sensation intact distally Intact pulses distally Dorsiflexion/Plantar flexion intact Dressing - dressing C/D/I Motor Function - intact, moving foot and toes well on exam. Patient unable to do straight leg raise on her own at this time. Has good quad tone.  Past Medical History  Diagnosis Date  . Diverticulitis   . Hypertension   . Psoriasis   . Urinary incontinence   . Osteoarthritis   . Restless  leg syndrome     Assessment/Plan: 1 Day Post-Op Procedure(s) (LRB): TOTAL KNEE ARTHROPLASTY (Right) Active Problems:   Total knee replacement status  Estimated body mass index is 40.75 kg/(m^2) as calculated from the following:   Height as of this encounter: 5\' 3"  (1.6 m).   Weight as of this encounter: 104.327 kg (230 lb). Up with therapy D/C IV fluids Discharge to SNF on Saturday of this week. She has a bed already it it would place.  Labs: Were reviewed. DVT Prophylaxis - Lovenox, Foot Pumps and TED hose Weight-Bearing as tolerated to right leg Need to start working on having a bowel movement today. D/C O2 and Pulse OX and try on Room [employer] R. [place] 04/28/2015, 6:59 AM

## 2015-04-28 NOTE — Progress Notes (Signed)
Physical Therapy Treatment Patient Details Name: Brooke Goodman MRN: [id number] DOB: 08-Jun-1941 Today's Date: 04/28/2015    History of Present Illness Pt is a 74yo white female who presents POD1 p elective R TKA.     PT Comments    Pt tolerating treatment session well, motivated and able to complete entire PT sesssion as planned. Pt reports single episode of emesis prior to entry, which RN is aware of, but no additional episodes during treatment. Pt continues to make progress toward goals as evidenced byimproved activity tolerance and ambulation distance. Pt's greatest limitation continues to be painful limitation to knee ROM which continues to limit ability to perform ambulation safely and at baseline function, as well as indep HEP. Patient presenting with impairment of strength, pain, range of motion, balance, and activity tolerance, limiting ability to perform ADL and mobility tasks at  baseline level of function. Patient will benefit from skilled intervention to address the above impairments and limitations, in order to restore to prior level of function, improve patient safety upon discharge, and to decrease caregiver burden.    Follow Up Recommendations  SNF     Equipment Recommendations  None recommended by PT    Recommendations for Other Services       Precautions / Restrictions Precautions Precautions: None Required Braces or Orthoses: Knee Immobilizer - Right Knee Immobilizer - Right: Discontinue once straight leg raise with < 10 degree lag Restrictions Weight Bearing Restrictions: Yes RLE Weight Bearing: Weight bearing as tolerated    Mobility  Bed Mobility Overal bed mobility: Needs Assistance Bed Mobility: Sit to Supine     Supine to sit: Supervision Sit to supine: Supervision   General bed mobility comments: able to perform with additional time and trapeze.   Transfers Overall transfer level: Needs assistance Equipment used: Rolling walker (2  wheeled) Transfers: Sit to/from Stand Sit to Stand: Min guard         General transfer comment: Able to perform without additional cues for DME use.   Ambulation/Gait Ambulation/Gait assistance: Min guard Ambulation Distance (Feet): 45 Feet Assistive device: Rolling walker (2 wheeled) Gait Pattern/deviations: Decreased step length - right;Decreased step length - left   Gait velocity interpretation: <1.8 ft/sec, indicative of risk for recurrent falls General Gait Details: Albeit increase in ambulation distance and tolerance, quality of gait parameters remains poor, with strong reliance on BUE, very small step length bilat, and progression of operative leg c straight knee. Lack of motoric diversity is concerning for pt safety adn recovery from any future  LOB.    Stairs            Wheelchair Mobility    Modified Rankin (Stroke Patients Only)       Balance Overall balance assessment: Modified Independent;No apparent balance deficits (not formally assessed)                                  Cognition Arousal/Alertness: Awake/alert Behavior During Therapy: WFL for tasks assessed/performed Overall Cognitive Status: Within Functional Limits for tasks assessed                      Exercises Total Joint Exercises Ankle Circles/Pumps: AROM;Both;20 reps;Supine Quad Sets: Supine;10 reps;Right;AAROM;Strengthening Short Arc Quad: AAROM;Strengthening;Right;10 reps;Supine Heel Slides: AAROM;Strengthening;Right;10 reps Hip ABduction/ADduction: AAROM;Strengthening;Right;10 reps;Supine Straight Leg Raises: AAROM;Strengthening;Right;10 reps;Supine Goniometric ROM: 9-38 degrees R knee flexion.     General Comments  Pertinent Vitals/Pain Pain Assessment: 0-10 Pain Score: 2  Pain Location: R knee, sharp and worse with movement.  Pain Descriptors / Indicators: Aching Pain Intervention(s): Limited activity within patient's tolerance;Monitored during  session;Premedicated before session;Ice applied    Home Living Family/patient expects to be discharged to:: Skilled nursing facility Living Arrangements: Alone Available Help at Discharge: Neighbor;Friend(s) Type of Home: House Home Access: Stairs to enter Entrance Stairs-Rails: Right Home Layout: One level        Prior Function Level of Independence: Independent      Comments: Limited community distances in ambulation.     PT Goals (current goals can now be found in the care plan section) Acute Rehab PT Goals Patient Stated Goal: Return to indep ambulation in community and full indep in IADL  PT Goal Formulation: With patient Time For Goal Achievement: 05/12/15 Potential to Achieve Goals: Good Progress towards PT goals: Progressing toward goals    Frequency  BID    PT Plan Current plan remains appropriate    Co-evaluation             End of Session Equipment Utilized During Treatment: Gait belt Activity Tolerance: Patient tolerated treatment well;Patient limited by pain;Other (comment) (Episode of emesis c continued nausea prior to entry, but no worsening of nausea during treatment. Pt reports continued dizziness. ) Patient left: with call bell/phone within reach;in bed;with bed alarm set;with family/visitor present     Time: 1259-1330 PT Time Calculation (min) (ACUTE ONLY): 31 min  Charges:  $Therapeutic Exercise: 8-22 mins $Therapeutic Activity: 8-22 mins                    G Codes:      Buccola,Allan C 12-May-2015, 1:42 PM 1:43 PM  Etta Grandchild, PT, DPT [place] License # [id number]

## 2015-04-28 NOTE — Clinical Social Work Placement (Signed)
   CLINICAL SOCIAL WORK PLACEMENT  NOTE  Date:  04/28/2015  Patient Details  Name: Brooke Goodman MRN: [id number] Date of Birth: 1941/04/27  Clinical Social Work is seeking post-discharge placement for this patient at the [place] level of care (*CSW will initial, date and re-position this form in  chart as items are completed):  Yes   Patient/family provided with [place] Work Department's list of facilities offering this level of care within the geographic area requested by the patient (or if unable, by the patient's family).  Yes   Patient/family informed of their freedom to choose among providers that offer the needed level of care, that participate in Medicare, Medicaid or managed care program needed by the patient, have an available bed and are willing to accept the patient.  Yes   Patient/family informed of [facility]'s ownership interest in [facility] and [facility], as well as of the fact that they are under no obligation to receive care at these facilities.  PASRR submitted to EDS on 04/28/15     PASRR number received on 04/28/15     Existing PASRR number confirmed on       FL2 transmitted to all facilities in geographic area requested by pt/family on 04/28/15     FL2 transmitted to all facilities within larger geographic area on       Patient informed that his/her managed care company has contracts with or will negotiate with certain facilities, including the following:        Yes   Patient/family informed of bed offers received.  Patient chooses bed at  [facility] )     Physician recommends and patient chooses bed at      Patient to be transferred to   on  .  Patient to be transferred to facility by       Patient family notified on   of transfer.  Name of family member notified:        PHYSICIAN Please sign FL2     Additional Comment:    _______________________________________________ Loralyn Freshwater, LCSW 04/28/2015, 11:47 AM

## 2015-04-28 NOTE — Evaluation (Signed)
Physical Therapy Evaluation Patient Details Name: Brooke Goodman MRN: [id number] DOB: 05-Oct-1940 Today's Date: 04/28/2015   History of Present Illness  Pt is a 74yo white female who presents POD1 p elective R TKA.   Clinical Impression   Pt is received semirecumbent in bed upon entry, asleep, but easily rousable and willing to participate. No acute distress noted. Pt is A&Ox3 and pleasant. Pt reports zero falls in the last 6 months. Pt strength as screened by functional mobility assessment reveals generalized weakness in BUE, as noted during use of RW during ambulation, and generalized weakness in BLE as noted during transfers. SaO2 is unremarkable on room air. Patient presenting with impairment of strength, range of motion, balance, and activity tolerance, limiting ability to perform ADL and mobility tasks at  baseline level of function. Patient will benefit from skilled intervention to address the above impairments and limitations, in order to restore to prior level of function, improve patient safety upon discharge, and to decrease falls risk.          Follow Up Recommendations SNF    Equipment Recommendations  None recommended by PT    Recommendations for Other Services       Precautions / Restrictions Precautions Precautions: None Required Braces or Orthoses: Knee Immobilizer - Right Knee Immobilizer - Right: Discontinue once straight leg raise with < 10 degree lag Restrictions Weight Bearing Restrictions: Yes RLE Weight Bearing: Weight bearing as tolerated      Mobility  Bed Mobility Overal bed mobility: Needs Assistance Bed Mobility: Supine to Sit     Supine to sit: Supervision     General bed mobility comments: Requires addiitonal time but able to perform s physical assistance.   Transfers Overall transfer level: Needs assistance Equipment used: Rolling walker (2 wheeled) Transfers: Sit to/from Stand Sit to Stand: Min guard;From elevated surface             Ambulation/Gait Ambulation/Gait assistance: Min guard Ambulation Distance (Feet): 8 Feet   Gait Pattern/deviations: Decreased step length - right;Decreased step length - left   Gait velocity interpretation: <1.8 ft/sec, indicative of risk for recurrent falls General Gait Details: bed to chair.   Stairs            Wheelchair Mobility    Modified Rankin (Stroke Patients Only)       Balance Overall balance assessment: Modified Independent;No apparent balance deficits (not formally assessed)                                           Pertinent Vitals/Pain Pain Assessment: 0-10 Pain Score: 4  Pain Location: R thigh  Pain Descriptors / Indicators: Aching Pain Intervention(s): Limited activity within patient's tolerance;Monitored during session;Ice applied;Premedicated before session    Home Living Family/patient expects to be discharged to:: Skilled nursing facility Living Arrangements: Alone Available Help at Discharge: Neighbor;Friend(s) Type of Home: House Home Access: Stairs to enter Entrance Stairs-Rails: Right Entrance Stairs-Number of Steps: 3 Home Layout: One level        Prior Function Level of Independence: Independent         Comments: Limited community distances in ambulation.       Hand Dominance   Dominant Hand: Right    Extremity/Trunk Assessment   Upper Extremity Assessment: Generalized weakness           Lower Extremity Assessment: Generalized weakness  Communication   Communication: No difficulties  Cognition Arousal/Alertness: Awake/alert Behavior During Therapy: WFL for tasks assessed/performed Overall Cognitive Status: Within Functional Limits for tasks assessed                      General Comments      Exercises Total Joint Exercises Ankle Circles/Pumps: AROM;Both;20 reps;Supine Quad Sets: Supine;10 reps;Right;AAROM;Strengthening Short Arc Quad: AAROM;Strengthening;Right;10  reps;Supine Heel Slides:  (does not tolerate) Hip ABduction/ADduction: AAROM;Strengthening;Right;10 reps;Supine Straight Leg Raises: AAROM;Strengthening;Right;10 reps;Supine Goniometric ROM: 9-38 degrees R knee flexion.       Assessment/Plan    PT Assessment Patient needs continued PT services  PT Diagnosis Difficulty walking;Abnormality of gait;Generalized weakness;Acute pain   PT Problem List Decreased strength;Decreased range of motion;Decreased activity tolerance;Decreased mobility;Decreased coordination;Pain  PT Treatment Interventions DME instruction;Gait training;Stair training;Functional mobility training;Patient/family education;Therapeutic activities;Therapeutic exercise;Balance training   PT Goals (Current goals can be found in the Care Plan section) Acute Rehab PT Goals Patient Stated Goal: Return to indep ambulation in community and full indep in IADL  PT Goal Formulation: With patient Time For Goal Achievement: 05/12/15 Potential to Achieve Goals: Good    Frequency BID   Barriers to discharge Inaccessible home environment;Decreased caregiver support      Co-evaluation               End of Session Equipment Utilized During Treatment: Gait belt Activity Tolerance: Patient tolerated treatment well;Patient limited by pain Patient left: in chair;with call bell/phone within reach;with chair alarm set Nurse Communication: Other (comment);Mobility status         Time: 1610-9604 PT Time Calculation (min) (ACUTE ONLY): 29 min   Charges:   PT Evaluation $Initial PT Evaluation Tier I: 1 Procedure PT Treatments $Therapeutic Exercise: 8-22 mins   PT G Codes:        Damion Kant C 16-May-2015, 10:22 AM  10:26 AM  Etta Grandchild, PT, DPT [place] License # [id number]

## 2015-04-28 NOTE — Care Management Note (Signed)
Case Management Note  Patient Details  Name: JOHNATHON MITTAL MRN: [id number] Date of Birth: 1941-07-05  Subjective/Objective:                  Met with patient to discuss discharge planning. She plans to go to [facility] authorization. She lives alone. She has family but no one at night. She has no DME. She has never used home health or SNF services. PT pending. She uses [facility]. [phone]  Action/Plan: List of home health providers left with patient. CSW updated.   Expected Discharge Date:  04/30/15               Expected Discharge Plan:     In-House Referral:  Clinical Social Work  Discharge planning Services  CM Consult  Post Acute Care Choice:  Home Health, Durable Medical Equipment Choice offered to:  Patient  DME Arranged:    DME Agency:     HH Arranged:    [place] Agency:     Status of Service:  In process, will continue to follow  Medicare Important Message Given:    Date Medicare IM Given:    Medicare IM give by:    Date Additional Medicare IM Given:    Additional Medicare Important Message give by:     If discussed at [place] of Stay Meetings, dates discussed:    Additional Comments:  Marshell Garfinkel, RN 04/28/2015, 8:49 AM

## 2015-04-28 NOTE — Plan of Care (Signed)
Problem: Acute Rehab PT Goals(only PT should resolve) Goal: Patient Will Transfer Sit To/From Stand Pt will transfer sit to/from-stand with RW at [place] without loss-of-balance to demonstrate good safety awareness for independent mobility in home.     Goal: Pt Will Ambulate Pt will ambulate with RW at Supervision using a step-through pattern and equal step length for a distances greater than 146ft to demonstrate the ability to perform safe household distance ambulation at discharge.    Goal: Pt Will Go Up/Down Stairs Pt will ascend/descend 3 stairs with LRAD and 1 HR at Supervision to demonstrate safe entry/exit of home.

## 2015-04-28 NOTE — Evaluation (Signed)
Occupational Therapy Evaluation Patient Details Name: Brooke Goodman MRN: [id number] DOB: 1941/02/17 Today's Date: 04/28/2015    History of Present Illness Pt is a 74yo white female who presents POD1 p elective R TKA.    Clinical Impression   Pt was independent in all ADLs living at home alone prior to surgery and is eager to return to PLOF.  She has a Secondary school teacher at home for picking items up off the floor but has never used it for dressing purposes.  Pt currently requires moderate assist for LB dressing while in seated position due to pain and limited AROM of R knee.  Pt would benefit from instruction in dressing techniques with or without assistive devices for dressing and bathing skills.  Pt has a stool in tub for showering and rec she use a shower chair with back instead to prevent falls and tipping over while showering. Rec further discussion for recommendations for home modifications to increase safety at home and prevent falls. Rec SNF for continued rehab since she lives at home alone.     Follow Up Recommendations  SNF    Equipment Recommendations  Tub/shower seat (she has a stool to sit in tub but rec a shower chair with back)    Recommendations for Other Services       Precautions / Restrictions Precautions Precautions: Fall Required Braces or Orthoses: Knee Immobilizer - Right Knee Immobilizer - Right: Discontinue once straight leg raise with < 10 degree lag Restrictions Weight Bearing Restrictions: Yes RLE Weight Bearing: Weight bearing as tolerated      Mobility Bed Mobility Overal bed mobility: Needs Assistance Bed Mobility: Sit to Supine       Sit to supine: Supervision   General bed mobility comments: able to perform with additional time and trapeze.   Transfers Overall transfer level: Needs assistance Equipment used: Rolling walker (2 wheeled) Transfers: Sit to/from Stand Sit to Stand: Min guard         General transfer comment: Able to perform  without additional cues for DME use.     Balance Overall balance assessment: Modified Independent;No apparent balance deficits (not formally assessed)                                          ADL Overall ADL's : Needs assistance/impaired                                       General ADL Comments: Pt requires assistance for LB dressing due to pain and decreased AROM.  She has a Secondary school teacher at home but needs edcuation in use for LB dressing skills.  She may need a long handled shoe horn and LH bath sponge as well.  She tends to wear slip on shoes at home.       Vision     Perception     Praxis      Pertinent Vitals/Pain Pain Assessment: 0-10 Pain Score: 4  Pain Location: R knee Pain Descriptors / Indicators: Sharp;Aching Pain Intervention(s): Limited activity within patient's tolerance;Monitored during session;Premedicated before session     Hand Dominance Right   Extremity/Trunk Assessment Upper Extremity Assessment Upper Extremity Assessment: Overall WFL for tasks assessed   Lower Extremity Assessment Lower Extremity Assessment: Generalized weakness       Communication Communication Communication:  No difficulties   Cognition Arousal/Alertness: Awake/alert Behavior During Therapy: WFL for tasks assessed/performed Overall Cognitive Status: Within Functional Limits for tasks assessed                     General Comments       Exercises       Shoulder Instructions      Home Living Family/patient expects to be discharged to:: Skilled nursing facility Living Arrangements: Alone Available Help at Discharge: Neighbor;Friend(s) Type of Home: House Home Access: Stairs to enter [employer] of Steps: 3 Entrance Stairs-Rails: Right Home Layout: One level                          Prior Functioning/Environment Level of Independence: Independent        Comments: was driving and going to grocery store  and running errands.    OT Diagnosis: Generalized weakness   OT Problem List: Decreased strength;Decreased range of motion;Decreased activity tolerance;Pain   OT Treatment/Interventions: Self-care/ADL training;DME and/or AE instruction    OT Goals(Current goals can be found in the care plan section) Acute Rehab OT Goals Patient Stated Goal: Return to indep ambulation in community and full indep in IADL  OT Goal Formulation: With patient Time For Goal Achievement: 05/12/15 Potential to Achieve Goals: Good  OT Frequency: Min 1X/week   Barriers to D/C:            Co-evaluation              End of Session Equipment Utilized During Treatment:  (reacher and sosck aid)  Activity Tolerance: Patient limited by pain Patient left: in bed;with call bell/phone within reach;with bed alarm set   Time: 1445-1515 OT Time Calculation (min): 30 min Charges:  OT General Charges $OT Visit: 1 Procedure OT Evaluation $Initial OT Evaluation Tier I: 1 Procedure OT Treatments $Self Care/Home Management : 8-22 mins G-Codes:    Wofford,Susan 19-May-2015, 3:45 PM    Chrys Racer, OTR/L ascom [phone]

## 2015-04-28 NOTE — Clinical Social Work Note (Signed)
Clinical Social Work Assessment  Patient Details  Name: Brooke Goodman MRN: [id number] Date of Birth: 06-Aug-1941  Date of referral:  04/28/15               Reason for consult:  Facility Placement                Permission sought to share information with:  Chartered certified accountant granted to share information::  Yes, Verbal Permission Granted  Name::      [place]::   [place]  Relationship::     Contact Information:     Housing/Transportation Living arrangements for the past 2 months:  [place] of Information:  Patient Patient Interpreter Needed:  None Criminal Activity/Legal Involvement Pertinent to Current Situation/Hospitalization:  No - Comment as needed Significant Relationships:  Adult Children Lives with:  Self Do you feel safe going back to the place where you live?  Yes Need for family participation in patient care:  No (Coment)  Care giving concerns: Patient lives alone in [place].    Social Worker assessment / plan: [place] representative (CSW) met with patient to discuss D/C plan. PT is recommending SNF. CSW met with patient alone at bedside and introduced self and explained role of CSW department. Patient was alert and oriented and sitting in the chair. Patient reported that she lives alone in [place]. Patient reported that her primary support person is her daughter [name]. Per patient [name] lives near by. CSW explained that PT is recommending rehab. Patient is agreeable to SNF search and prefers [facility]   Kim admissions coordinator at [facility] made a bed offer. Patient accept offer. Plan is for patient to D/C to [facility] Saturday 04/30/15.   Employment status:  Retired Nurse, adult PT Recommendations:  [place] / Referral to community resources:  [place]  Patient/Family's Response to care: Patient is  agreeable to rehab at [facility].   Patient/Family's Understanding of and Emotional Response to Diagnosis, Current Treatment, and Prognosis: Patient was pleasant throughout assessment. Patient thanked CSW for visit.   Emotional Assessment Appearance:  Appears stated age Attitude/Demeanor/Rapport:    Affect (typically observed):  Accepting, Pleasant Orientation:  Oriented to Self, Oriented to Place, Oriented to  Time, Oriented to Situation Alcohol / Substance use:  Not Applicable Psych involvement (Current and /or in the community):  No (Comment)  Discharge Needs  Concerns to be addressed:  Discharge Planning Concerns Readmission within the last 30 days:  No Current discharge risk:  Lives alone Barriers to Discharge:  Continued Medical Work up   Loralyn Freshwater, LCSW 04/28/2015, 11:48 AM

## 2015-04-28 NOTE — Anesthesia Postprocedure Evaluation (Signed)
  Anesthesia Post-op Note  Patient: Brooke Goodman  Procedure(s) Performed: Procedure(s): TOTAL KNEE ARTHROPLASTY (Right)  Anesthesia type:Spinal, MAC  Patient location: 135A  Post pain: Pain level controlled  Post assessment: Post-op Vital signs reviewed, Patient's Cardiovascular Status Stable, Respiratory Function Stable, Patent Airway and No signs of Nausea or vomiting  Post vital signs: Reviewed and stable  Last Vitals:  Filed Vitals:   04/28/15 0318  BP: 101/49  Pulse: 58  Temp: 36.7 C  Resp: 20    Level of consciousness: awake, alert  and patient cooperative  Complications: No apparent anesthesia complications

## 2015-04-29 LAB — CBC
HCT: 34.1 % — ABNORMAL LOW (ref 35.0–47.0)
Hemoglobin: 11.6 g/dL — ABNORMAL LOW (ref 12.0–16.0)
MCH: 31.1 pg (ref 26.0–34.0)
MCHC: 34 g/dL (ref 32.0–36.0)
MCV: 91.5 fL (ref 80.0–100.0)
PLATELETS: 239 10*3/uL (ref 150–440)
RBC: 3.73 MIL/uL — AB (ref 3.80–5.20)
RDW: 12.6 % (ref 11.5–14.5)
WBC: 10.9 10*3/uL (ref 3.6–11.0)

## 2015-04-29 MED ORDER — HYDROCODONE-ACETAMINOPHEN 5-325 MG PO TABS: 1.0000 | ORAL_TABLET | ORAL | Status: DC | PRN

## 2015-04-29 MED ORDER — ENOXAPARIN SODIUM 40 MG/0.4ML ~~LOC~~ SOLN: 40.0000 mg | SUBCUTANEOUS | Status: DC

## 2015-04-29 MED ORDER — TRAMADOL HCL 50 MG PO TABS: 50.0000 mg | ORAL_TABLET | ORAL | Status: DC | PRN

## 2015-04-29 MED ORDER — HYDROCODONE-ACETAMINOPHEN 5-325 MG PO TABS
1.0000 | ORAL_TABLET | ORAL | Status: DC | PRN
  Administered 2015-04-29 – 2015-04-30 (×6): 1 via ORAL
  Filled 2015-04-29 (×6): qty 1

## 2015-04-29 NOTE — Progress Notes (Signed)
Physical Therapy Treatment Patient Details Name: Brooke Goodman MRN: [id number] DOB: 10-20-1940 Today's Date: 04/29/2015    History of Present Illness Pt is a 74yo white female who presents POD1 p elective R TKA.     PT Comments    Pt tolerating treatment session well, motivated and able to complete entire PT sesssion as planned. Pt continues to make progress toward goals as evidenced by improved pain control and activity tolerance c ambulation. Pt's greatest limitation continues to be painfully limited ROM which continues to limit ability to perform HEP independently and mobility  at baseline function. Patient presenting with impairment of strength, pain, range of motion, balance, and activity tolerance, limiting ability to perform ADL and mobility tasks at  baseline level of function. Patient will benefit from skilled intervention to address the above impairments and limitations, in order to restore to prior level of function, improve patient safety upon discharge, and to decrease caregiver burden.    Follow Up Recommendations  SNF     Equipment Recommendations  None recommended by PT    Recommendations for Other Services       Precautions / Restrictions Precautions Precautions: Fall Required Braces or Orthoses: Knee Immobilizer - Right Knee Immobilizer - Right: Discontinue once straight leg raise with < 10 degree lag Restrictions Weight Bearing Restrictions: Yes RLE Weight Bearing: Weight bearing as tolerated    Mobility  Bed Mobility Overal bed mobility: Needs Assistance Bed Mobility: Sit to Supine;Supine to Sit     Supine to sit: Supervision Sit to supine: Supervision   General bed mobility comments: Practiced from L EOB this session. Pt uses trapeze to pull self up in bed.   Transfers Overall transfer level: Needs assistance Equipment used: Rolling walker (2 wheeled) Transfers: Sit to/from Stand Sit to Stand: Supervision         General transfer comment:  Pt has very short legs, hance all transfers are from a relative elevated surace as pt is in less knee flexion while sitting EOB.   Ambulation/Gait Ambulation/Gait assistance: Min guard Ambulation Distance (Feet): 90 Feet Assistive device: Rolling walker (2 wheeled)   Gait velocity: 0.65ft/s Gait velocity interpretation: <1.8 ft/sec, indicative of risk for recurrent falls General Gait Details: Pt given cues to improve L step length distance; pt able to correct to equal step length bilat, both c step-to gait pattern. Pt reports better pain response once up and walking, says that the knee feels good.    Stairs            Wheelchair Mobility    Modified Rankin (Stroke Patients Only)       Balance Overall balance assessment: Modified Independent;No apparent balance deficits (not formally assessed)                                  Cognition Arousal/Alertness: Awake/alert Behavior During Therapy: WFL for tasks assessed/performed Overall Cognitive Status: Within Functional Limits for tasks assessed                      Exercises Total Joint Exercises Quad Sets: Supine;Right;AAROM;Strengthening;15 reps Short Arc Quad: AAROM;Strengthening;Right;Supine;15 reps Heel Slides: AAROM;Strengthening;Right;15 reps;Supine    General Comments        Pertinent Vitals/Pain Pain Assessment: 0-10 Pain Score: 2  Pain Location: R lateral hip  Pain Descriptors / Indicators: Cramping;Sore Pain Intervention(s): Limited activity within patient's tolerance;Monitored during session;Premedicated before session;Ice applied    Home  Living                      Prior Function            PT Goals (current goals can now be found in the care plan section) Acute Rehab PT Goals Patient Stated Goal: Return to indep ambulation in community and full indep in IADL  PT Goal Formulation: With patient Time For Goal Achievement: 05/12/15 Potential to Achieve Goals:  Good Progress towards PT goals: Progressing toward goals    Frequency  BID    PT Plan Current plan remains appropriate    Co-evaluation             End of Session Equipment Utilized During Treatment: Gait belt Activity Tolerance: Patient tolerated treatment well;Patient limited by pain;Other (comment) Patient left: with call bell/phone within reach;in bed;with bed alarm set     Time: 0865-7846 PT Time Calculation (min) (ACUTE ONLY): 18 min  Charges:  $Therapeutic Activity: 8-22 mins                    G Codes:      Caprice Mccaffrey C 23-May-2015, 4:28 PM 4:30 PM  Etta Grandchild, PT, DPT [place] License # [id number]

## 2015-04-29 NOTE — Discharge Summary (Signed)
Physician Discharge Summary  Patient ID: Brooke Goodman MRN: [id number] DOB/AGE: 16-Dec-1940 74 y.o.  Admit date: 04/27/2015 Discharge date: 04/29/2015  Admission Diagnoses:  [place] RIGHT KNEE   Discharge Diagnoses: Patient Active Problem List   Diagnosis Date Noted  . 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 Actinic keratosis 07/23/2014  . Advanced directives, counseling/discussion 07/23/2014  . Severe obesity (BMI >= 40) 07/22/2013  . Routine general medical examination at a health care facility 01/18/2012  . Psoriasis   . Essential hypertension, benign 11/03/2010  . DIVERTICULOSIS, COLON 11/03/2010  . OSTEOARTHRITIS 11/03/2010  . Urinary incontinence 11/03/2010    Past Medical History  Diagnosis Date  . Diverticulitis   . Hypertension   . Psoriasis   . Urinary incontinence   . Osteoarthritis   . Restless leg syndrome      Transfusion: Autovac transfusions given the first 6 hours postoperatively   Consultants (if any):   case management  Discharged Condition: Improved  Hospital Course: Brooke Goodman is an 74 y.o. female who was admitted 04/27/2015 with a diagnosis of degenerative arthrosis right knee and went to the operating room on 04/27/2015 and underwent the above named procedures.    Surgeries:Procedure(s): TOTAL KNEE ARTHROPLASTY on 04/27/2015  ASSISTANT: Vance Peper, PA (present and scrubbed throughout the case, critical for assistance with exposure, retraction, instrumentation, and closure)  ANESTHESIA: spinal  ESTIMATED BLOOD LOSS: 75 mL  FLUIDS REPLACED: 1800 mL of crystalloid  TOURNIQUET TIME: 103 minutes  DRAINS: 2 medium drains to a reinfusion system  SOFT TISSUE RELEASES: Anterior cruciate ligament, posterior cruciate ligament, deep medial collateral ligament, patellofemoral ligament, posterolateral corner  IMPLANTS UTILIZED: DePuy PFC Sigma size 4N (narrow) posterior stabilized femoral component (cemented), size 4  MBT tibial component (cemented), 35 mm 3 peg oval dome patella (cemented), and a 12.5 mm stabilized rotating platform polyethylene insert.  INDICATIONS FOR SURGERY: Brooke Goodman is a 74 y.o. year old female with a long history of progressive knee pain. X-rays demonstrated severe degenerative changes in tricompartmental fashion. The patient had not seen any significant improvement despite conservative nonsurgical intervention. After discussion of the risks and benefits of surgical intervention, the patient expressed understanding of the risks benefits and agree with plans for total knee arthroplasty.   Patient tolerated the surgery well. No complications .Patient was taken to PACU where she was stabilized and then transferred to the orthopedic floor.  Patient started on Lovenox 30 mg q 12 hrs. Foot pumps applied bilaterally at 80 mm hg. Heels elevated off bed with rolled towels. No evidence of DVT. Calves non tender. Negative Homan. Physical therapy started on day #1 for gait training and transfer with OT starting on  day #1 for ADL and assisted devices. Patient has done well with therapy. Ambulated 50  feet upon being discharged.  Patient's IV , foley were discontinued on day #1 and hemovac was d/c on day #2.   She was given perioperative antibiotics:  Anti-infectives    Start     Dose/Rate Route Frequency Ordered Stop   04/27/15 1230  ceFAZolin (ANCEF) IVPB 2 g/50 mL premix     2 g 100 mL/hr over 30 Minutes Intravenous Every 6 hours 04/27/15 1227 04/28/15 0917   04/27/15 0556  ceFAZolin (ANCEF) 2-3 GM-% IVPB SOLR    Comments:  Hallaji, Violet Ann: cabinet override      04/27/15 0556 04/27/15 1759   04/27/15 0330  ceFAZolin (ANCEF) IVPB 2 g/50 mL premix     2 g  100 mL/hr over 30 Minutes Intravenous  Once 04/27/15 0316 04/27/15 0750    .  She was fitted with AV 1 compression foot pumps, early ambulation, and Lovenox and talking for DVT prophylaxis.  She benefited maximally from the  hospital stay and there were no complications.    Recent vital signs:  Filed Vitals:   04/29/15 0738  BP: 122/41  Pulse: 72  Temp: 98.3 F (36.8 C)  Resp: 18    Recent laboratory studies:  Lab Results  Component Value Date   HGB 11.6* 04/29/2015   HGB 10.5* 04/28/2015   HGB 13.9 04/13/2015   Lab Results  Component Value Date   WBC 10.9 04/29/2015   PLT 239 04/29/2015   Lab Results  Component Value Date   INR 1.02 04/13/2015   Lab Results  Component Value Date   NA 137 04/28/2015   K 3.7 04/28/2015   CL 103 04/28/2015   CO2 26 04/28/2015   BUN 21* 04/28/2015   CREATININE 0.84 04/28/2015   GLUCOSE 114* 04/28/2015    Discharge Medications:     Medication List    TAKE these medications        acetaminophen 650 MG CR tablet  Commonly known as:  TYLENOL  Take 650 mg by mouth 3 (three) times daily.     clobetasol cream 0.05 %  Commonly known as:  TEMOVATE  Apply 1 application topically 2 (two) times daily.     enoxaparin 40 MG/0.4ML injection  Commonly known as:  LOVENOX  Inject 0.4 mLs (40 mg total) into the skin daily.     HYDROcodone-acetaminophen 5-325 MG per tablet  Commonly known as:  NORCO/VICODIN  Take 1-2 tablets by mouth every 4 (four) hours as needed for moderate pain.     multivitamin tablet  Take 1 tablet by mouth daily.     traMADol 50 MG tablet  Commonly known as:  ULTRAM  Take 1-2 tablets (50-100 mg total) by mouth every 4 (four) hours as needed for moderate pain.     triamterene-hydrochlorothiazide 37.5-25 MG per tablet  Commonly known as:  MAXZIDE-25  Take 1 tablet by mouth daily.     Vitamin D-3 1000 UNITS Caps  Take 1,000 Units by mouth daily.        Diagnostic Studies: Dg Knee Right Port  04/27/2015   CLINICAL DATA:  Total knee arthroplasty.  EXAM: PORTABLE RIGHT KNEE - 1-2 VIEW  COMPARISON:  None.  FINDINGS: A right total knee arthroplasty is noted. The joint is located. A symmetric spacing is present. Drain is in place.  Skin staples are noted.  IMPRESSION: Right total knee arthroplasty without radiographic evidence for complication.   Electronically Signed   By: San Morelle M.D.   On: 04/27/2015 11:51    Disposition: Final discharge disposition not confirmed      Discharge Instructions    Diet - low sodium heart healthy    Complete by:  As directed      Increase activity slowly    Complete by:  As directed            Follow-up Information    Follow up with [facility] R[facility] PA On 05/12/2015.   Specialty:  Physician Assistant   Why:  at 8:45am   Contact information:   [place] [address] [phone]       Follow up with Dereck Leep, MD On 06/09/2015.   Specialty:  Orthopedic Surgery   Why:  at 9:15am  Contact information:   [place] [id number] [phone]        Signed: Watt Climes 04/29/2015, 8:06 AM

## 2015-04-29 NOTE — Progress Notes (Signed)
Physical Therapy Treatment Patient Details Name: Brooke Goodman MRN: [id number] DOB: 1941-06-21 Today's Date: 04/29/2015    History of Present Illness Pt is a 74yo white female who presents POD1 p elective R TKA.     PT Comments    Pt tolerating treatment session well, motivated and able to complete entire PT sesssion as planned. Pt continues to make progress toward goals as evidenced by improved pain control and ROM. Pt's greatest limitation continues to be AROM during ambulation and mobility which continues to limit ability to perform mobility tasks at baseline function. Patient presenting with impairment of strength, pain, range of motion, balance, and activity tolerance, limiting ability to perform ADL and mobility tasks at  baseline level of function. Patient will benefit from skilled intervention to address the above impairments and limitations, in order to restore to prior level of function, improve patient safety upon discharge, and to decrease caregiver burden.    Follow Up Recommendations  SNF     Equipment Recommendations  None recommended by PT    Recommendations for Other Services       Precautions / Restrictions      Mobility  Bed Mobility Overal bed mobility: Needs Assistance Bed Mobility: Sit to Supine     Supine to sit: Supervision     General bed mobility comments: Requires less effort today and less time overall.   Transfers Overall transfer level: Needs assistance Equipment used: Rolling walker (2 wheeled) Transfers: Sit to/from Stand Sit to Stand: Min guard;From elevated surface         General transfer comment: Pt has very short legs, hance all transfers are from a relative elevated surace as pt is in less knee flexion while sitting EOB.   Ambulation/Gait Ambulation/Gait assistance: Min guard Ambulation Distance (Feet): 85 Feet Assistive device: Rolling walker (2 wheeled)     Gait velocity interpretation: <1.8 ft/sec, indicative of risk  for recurrent falls General Gait Details: Pt given cues to improve L step length distance; pt able to correct to equal step length bilat, both c step-to gait pattern. Pt reports better pain response once up and walking.    Stairs            Wheelchair Mobility    Modified Rankin (Stroke Patients Only)       Balance Overall balance assessment: Modified Independent;No apparent balance deficits (not formally assessed)                                  Cognition Arousal/Alertness: Awake/alert Behavior During Therapy: WFL for tasks assessed/performed Overall Cognitive Status: Within Functional Limits for tasks assessed                      Exercises Total Joint Exercises Ankle Circles/Pumps: AROM;Both;20 reps;Supine Quad Sets: Supine;Right;AAROM;Strengthening;15 reps Short Arc Quad: AAROM;Strengthening;Right;Supine;15 reps Heel Slides: AAROM;Strengthening;Right;15 reps;Supine Hip ABduction/ADduction: AAROM;Strengthening;Right;Supine;15 reps Straight Leg Raises: AAROM;Strengthening;Right;Supine;15 reps Goniometric ROM: 5-78 degrees; much improved since yesterday, improved pain response.     General Comments        Pertinent Vitals/Pain Pain Assessment: 0-10 Pain Score: 2  Pain Location: R lateral hip Pain Descriptors / Indicators: Cramping;Sore Pain Intervention(s): Limited activity within patient's tolerance;Premedicated before session;Monitored during session    Home Living                      Prior Function  PT Goals (current goals can now be found in the care plan section) Acute Rehab PT Goals Patient Stated Goal: Return to indep ambulation in community and full indep in IADL  PT Goal Formulation: With patient Time For Goal Achievement: 05/12/15 Potential to Achieve Goals: Good Progress towards PT goals: Progressing toward goals    Frequency  BID    PT Plan Current plan remains appropriate    Co-evaluation              End of Session Equipment Utilized During Treatment: Gait belt Activity Tolerance: Patient tolerated treatment well;Patient limited by pain;Other (comment) Patient left: with call bell/phone within reach;in bed;with bed alarm set;with family/visitor present     Time: 0093-8182 PT Time Calculation (min) (ACUTE ONLY): 38 min  Charges:  $Gait Training: 8-22 mins $Therapeutic Exercise: 23-37 mins                    G Codes:      Jameel Quant C 2015/05/20, 10:05 AM  10:08 AM  Etta Grandchild, PT, DPT  License # [id number]

## 2015-04-29 NOTE — Care Management Important Message (Signed)
Important Message  Patient Details  Name: Brooke Goodman MRN: [id number] Date of Birth: Apr 17, 1941   Medicare Important Message Given:  Yes-second notification given    Juliann Pulse A Allmond 04/29/2015, 9:55 AM

## 2015-04-29 NOTE — Progress Notes (Signed)
   Subjective: 2 Days Post-Op Procedure(s) (LRB): TOTAL KNEE ARTHROPLASTY (Right) Patient reports pain as moderate.   Patient is well, and has had no acute complaints or problems Continue with physical therapy today.  Plan is to go Rehab after hospital stay. no nausea and no vomiting. Did have some yesterday but was felt secondary to the oxycodone. Tramadol last night and was able tolerate this well. Patient denies any chest pains or shortness of breath. Objective: Vital signs in last 24 hours: Temp:  [98.4 F (36.9 C)-98.8 F (37.1 C)] 98.4 F (36.9 C) (08/18 2053) Pulse Rate:  [63-77] 77 (08/18 2053) Resp:  [16-18] 16 (08/18 2053) BP: (118-146)/(40-50) 118/50 mmHg (08/18 2053) SpO2:  [98 %-100 %] 99 % (08/18 2053) well approximated incision Heels are non tender and elevated off the bed using rolled towels Intake/Output from previous day: 08/18 0701 - 08/19 0700 In: 360 [P.O.:360] Out: 1695 [Urine:1625; Drains:70] Intake/Output this shift:     Recent Labs  04/28/15 0450 04/29/15 0435  HGB 10.5* 11.6*    Recent Labs  04/28/15 0450 04/29/15 0435  WBC 9.9 10.9  RBC 3.46* 3.73*  HCT 31.9* 34.1*  PLT 219 239    Recent Labs  04/28/15 0450  NA 137  K 3.7  CL 103  CO2 26  BUN 21*  CREATININE 0.84  GLUCOSE 114*  CALCIUM 8.2*   No results for input(s): LABPT, INR in the last 72 hours.  EXAM General - Patient is Alert, Appropriate and Oriented Extremity - Neurologically intact Neurovascular intact Sensation intact distally Intact pulses distally Dorsiflexion/Plantar flexion intact Dressing - dressing C/D/I Motor Function - intact, moving foot and toes well on exam. Unable to do straight leg raise own  Past Medical History  Diagnosis Date  . Diverticulitis   . Hypertension   . Psoriasis   . Urinary incontinence   . Osteoarthritis   . Restless leg syndrome     Assessment/Plan: 2 Days Post-Op Procedure(s) (LRB): TOTAL KNEE ARTHROPLASTY  (Right) Active Problems:   Total knee replacement status  Estimated body mass index is 40.75 kg/(m^2) as calculated from the following:   Height as of this encounter: 5\' 3"  (1.6 m).   Weight as of this encounter: 104.327 kg (230 lb). Up with therapy Plan for discharge tomorrow Discharge to SNF  Labs: Were reviewed DVT Prophylaxis - Lovenox, Foot Pumps and TED hose Weight-Bearing as tolerated to right leg Patient needs a bowel movement today. Hemovac was discontinued on today's visit We'll try patient on hydrocodone in place of the oxycodone Patient really needs to work on range of motion of the knee.    Jillyn Ledger. Zwingle Curtice 04/29/2015, 7:31 AM

## 2015-04-29 NOTE — Progress Notes (Signed)
Plan is for patient to D/C to [facility] tomorrow (04/30/15). Per admissions coordinator at [facility] patient is going to room 203-A. RN will call report at [phone]. Clinical Education officer, museum (CSW) sent D/C Summary to [employer] [facility] via [facility]finder today. CSW also faxed signed consent forms to [facility]. Per patient her sister Brooke Goodman will provide transport. Patient is aware of above. CSW will continue to follow and assist as needed.   [name], [place] [phone]

## 2015-04-29 NOTE — Discharge Instructions (Signed)

## 2015-04-30 DIAGNOSIS — M159 Polyosteoarthritis, unspecified: Secondary | ICD-10-CM | POA: Diagnosis not present

## 2015-04-30 DIAGNOSIS — M6281 Muscle weakness (generalized): Secondary | ICD-10-CM | POA: Diagnosis not present

## 2015-04-30 DIAGNOSIS — M25561 Pain in right knee: Secondary | ICD-10-CM | POA: Diagnosis not present

## 2015-04-30 DIAGNOSIS — L409 Psoriasis, unspecified: Secondary | ICD-10-CM | POA: Diagnosis not present

## 2015-04-30 DIAGNOSIS — I10 Essential (primary) hypertension: Secondary | ICD-10-CM | POA: Diagnosis not present

## 2015-04-30 DIAGNOSIS — M25569 Pain in unspecified knee: Secondary | ICD-10-CM | POA: Diagnosis not present

## 2015-04-30 DIAGNOSIS — R2689 Other abnormalities of gait and mobility: Secondary | ICD-10-CM | POA: Diagnosis not present

## 2015-04-30 DIAGNOSIS — Z471 Aftercare following joint replacement surgery: Secondary | ICD-10-CM | POA: Diagnosis not present

## 2015-04-30 DIAGNOSIS — Z96651 Presence of right artificial knee joint: Secondary | ICD-10-CM | POA: Diagnosis not present

## 2015-04-30 LAB — CBC
HEMATOCRIT: 32.1 % — AB (ref 35.0–47.0)
HEMOGLOBIN: 10.8 g/dL — AB (ref 12.0–16.0)
MCH: 30.9 pg (ref 26.0–34.0)
MCHC: 33.6 g/dL (ref 32.0–36.0)
MCV: 92.2 fL (ref 80.0–100.0)
Platelets: 230 10*3/uL (ref 150–440)
RBC: 3.48 MIL/uL — ABNORMAL LOW (ref 3.80–5.20)
RDW: 12.8 % (ref 11.5–14.5)
WBC: 9.9 10*3/uL (ref 3.6–11.0)

## 2015-04-30 NOTE — Progress Notes (Signed)
Physical Therapy Treatment Patient Details Name: Brooke Goodman MRN: [id number] DOB: Jan 30, 1941 Today's Date: 04/30/2015    History of Present Illness Pt is a 74yo white female who presents POD1 p elective R TKA.     PT Comments    Pt tolerating treatment session well, motivated and able to complete entire PT sesssion as planned. Pt continues to make progress toward goals as evidenced by improved strength demonstrated during LAQ and manually resisted knee flexion in sitting. Pt's greatest limitation continues to be restricted flexion of the R knee which continues to limit ability to perform ambulation at baseline function and HEP indep. Patient presenting with impairment of strength, pain, range of motion, balance, and activity tolerance, limiting ability to perform ADL and mobility tasks at  baseline level of function. Patient will benefit from skilled intervention to address the above impairments and limitations, in order to restore to prior level of function, improve patient safety upon discharge, and to decrease caregiver burden.    Follow Up Recommendations  SNF     Equipment Recommendations  None recommended by PT    Recommendations for Other Services       Precautions / Restrictions Precautions Precautions: Fall Required Braces or Orthoses: Knee Immobilizer - Right Knee Immobilizer - Right: Discontinue once straight leg raise with < 10 degree lag Restrictions Weight Bearing Restrictions: Yes RLE Weight Bearing: Weight bearing as tolerated    Mobility  Bed Mobility               General bed mobility comments: Received up in chair.   Transfers Overall transfer level: Needs assistance Equipment used: Rolling walker (2 wheeled) Transfers: Sit to/from Stand Sit to Stand: Supervision         General transfer comment: given verbal cues for equal weight bearing on BLE once standing.   Ambulation/Gait Ambulation/Gait assistance: Min guard Ambulation Distance  (Feet): 120 Feet Assistive device: Rolling walker (2 wheeled)   Gait velocity: 0.16ft/sec Gait velocity interpretation: <1.8 ft/sec, indicative of risk for recurrent falls General Gait Details: Very short steps, shorter on L; given verbal cues to correct and equalize. Pt alterantes between a three point and two point gait.    Stairs            Wheelchair Mobility    Modified Rankin (Stroke Patients Only)       Balance Overall balance assessment: Modified Independent;No apparent balance deficits (not formally assessed)                                  Cognition Arousal/Alertness: Awake/alert Behavior During Therapy: WFL for tasks assessed/performed Overall Cognitive Status: Within Functional Limits for tasks assessed                      Exercises Total Joint Exercises Long Arc Quad: AAROM;Strengthening;Right;10 reps;Seated Knee Flexion: AAROM;Strengthening;Right;10 reps;Seated Goniometric ROM: 5-73 degrees; following Dr. Clydell Hakim advice to patient to allow some time c feet down in sitting to work on active knee flexion ROM. Asked pt to limit to 45-60 minutes this morning.     General Comments        Pertinent Vitals/Pain Pain Assessment: 0-10 Pain Score: 3  Pain Location: thigh, lateral hip on R  Pain Descriptors / Indicators: Cramping;Sore Pain Intervention(s): Monitored during session;Premedicated before session;Limited activity within patient's tolerance    Home Living  Prior Function            PT Goals (current goals can now be found in the care plan section) Acute Rehab PT Goals Patient Stated Goal: Return to indep ambulation in community and full indep in IADL  PT Goal Formulation: With patient Time For Goal Achievement: 05/12/15 Potential to Achieve Goals: Good Progress towards PT goals: Progressing toward goals    Frequency  BID    PT Plan Current plan remains appropriate     Co-evaluation             End of Session Equipment Utilized During Treatment: Gait belt Activity Tolerance: Patient tolerated treatment well;Patient limited by pain;Other (comment) Patient left: with call bell/phone within reach;in chair;with chair alarm set     Time: [phone] PT Time Calculation (min) (ACUTE ONLY): 23 min  Charges:  $Gait Training: 8-22 mins $Therapeutic Exercise: 8-22 mins                    G Codes:      Helio Lack C 17-May-2015, 9:10 AM 9:11 AM  Etta Grandchild, PT, DPT [place] License # [id number]

## 2015-04-30 NOTE — Progress Notes (Signed)
Discharged to [facility] via sister.

## 2015-04-30 NOTE — Progress Notes (Signed)
Report called to [place] at [facility]. Dressing changed to right new. Patient stated sister will be transporting her to [facility] around 1:30.

## 2015-04-30 NOTE — Clinical Social Work Note (Signed)
Patient to discharge today to [facility] and discharge summary previously sent. Patient is aware and in agreement with discharge today and patient's sister is aware and to transport her to [facility] today. Shela Leff MSW,LCSW [phone]

## 2015-04-30 NOTE — Progress Notes (Signed)
   Subjective: 3 Days Post-Op Procedure(s) (LRB): TOTAL KNEE ARTHROPLASTY (Right) Patient reports pain as mild.   Patient is well, and has had no acute complaints or problems Continue with physical therapy today.  Plan is to go Rehab after hospital stay. no nausea and no vomiting Patient denies any chest pains or shortness of breath. Objective: Vital signs in last 24 hours: Temp:  [98.3 F (36.8 C)-99.7 F (37.6 C)] 99.7 F (37.6 C) (08/20 0448) Pulse Rate:  [72-81] 75 (08/20 0448) Resp:  [18] 18 (08/20 0448) BP: (93-122)/(41-62) 113/56 mmHg (08/20 0448) SpO2:  [95 %-100 %] 100 % (08/20 0448) well approximated incision Heels are non tender and elevated off the bed using rolled towels Intake/Output from previous day: 08/19 0701 - 08/20 0700 In: 720 [P.O.:720] Out: 1000 [Urine:1000] Intake/Output this shift:     Recent Labs  04/28/15 0450 04/29/15 0435 04/30/15 0344  HGB 10.5* 11.6* 10.8*    Recent Labs  04/29/15 0435 04/30/15 0344  WBC 10.9 9.9  RBC 3.73* 3.48*  HCT 34.1* 32.1*  PLT 239 230    Recent Labs  04/28/15 0450  NA 137  K 3.7  CL 103  CO2 26  BUN 21*  CREATININE 0.84  GLUCOSE 114*  CALCIUM 8.2*   No results for input(s): LABPT, INR in the last 72 hours.  EXAM General - Patient is Alert, Appropriate and Oriented Extremity - Neurologically intact ABD soft Neurovascular intact Sensation intact distally Intact pulses distally Dorsiflexion/Plantar flexion intact Dressing - scant drainage Motor Function - intact, moving foot and toes well on exam.   Past Medical History  Diagnosis Date  . Diverticulitis   . Hypertension   . Psoriasis   . Urinary incontinence   . Osteoarthritis   . Restless leg syndrome     Assessment/Plan: 3 Days Post-Op Procedure(s) (LRB): TOTAL KNEE ARTHROPLASTY (Right) Active Problems:   Total knee replacement status  Estimated body mass index is 40.75 kg/(m^2) as calculated from the following:   Height as  of this encounter: 5\' 3"  (1.6 m).   Weight as of this encounter: 104.327 kg (230 lb). Up with therapy Discharge to SNF  Labs: Reviewed DVT Prophylaxis - Lovenox, Foot Pumps and TED hose Weight-Bearing as tolerated to right leg Please change dressing to a new honeycombed prior to being discharged  Jon R. [place] Gratz 04/30/2015, 7:01 AM

## 2015-04-30 NOTE — Clinical Social Work Placement (Signed)
   CLINICAL SOCIAL WORK PLACEMENT  NOTE  Date:  04/30/2015  Patient Details  Name: Brooke Goodman MRN: [id number] Date of Birth: [birth year]  Clinical Social Work is seeking post-discharge placement for this patient at the [place] level of care (*CSW will initial, date and re-position this form in  chart as items are completed):  Yes   Patient/family provided with [facility] Work Department's list of facilities offering this level of care within the geographic area requested by the patient (or if unable, by the patient's family).  Yes   Patient/family informed of their freedom to choose among providers that offer the needed level of care, that participate in Medicare, Medicaid or managed care program needed by the patient, have an available bed and are willing to accept the patient.  Yes   Patient/family informed of [facility]'s ownership interest in [facility] and [facility], as well as of the fact that they are under no obligation to receive care at these facilities.  PASRR submitted to EDS on 04/28/15     PASRR number received on 04/28/15     Existing PASRR number confirmed on       FL2 transmitted to all facilities in geographic area requested by pt/family on 04/28/15     FL2 transmitted to all facilities within larger geographic area on       Patient informed that his/her managed care company has contracts with or will negotiate with certain facilities, including the following:        Yes   Patient/family informed of bed offers received.  Patient chooses bed at  [facility] )     Physician recommends and patient chooses bed at  [facility])    Patient to be transferred to  [facility]) on  .  Patient to be transferred to facility by  (sister)     Patient family notified on 04/30/15 of transfer.  Name of family member notified:   (sister)     PHYSICIAN Please sign FL2     Additional Comment:     _______________________________________________ Shela Leff, LCSW 04/30/2015, 9:06 AM

## 2015-05-02 ENCOUNTER — Other Ambulatory Visit: Payer: Self-pay | Admitting: Gerontology

## 2015-05-02 DIAGNOSIS — M25561 Pain in right knee: Secondary | ICD-10-CM

## 2015-05-03 DIAGNOSIS — I10 Essential (primary) hypertension: Secondary | ICD-10-CM | POA: Diagnosis not present

## 2015-05-03 DIAGNOSIS — M25569 Pain in unspecified knee: Secondary | ICD-10-CM | POA: Diagnosis not present

## 2015-05-03 DIAGNOSIS — M159 Polyosteoarthritis, unspecified: Secondary | ICD-10-CM | POA: Diagnosis not present

## 2015-05-12 ENCOUNTER — Encounter
Admission: RE | Admit: 2015-05-12 | Discharge: 2015-05-12 | Disposition: A | Discharge status: Home / Self Care | Payer: Medicare Other | Source: Ambulatory Visit | Attending: Internal Medicine | Admitting: Internal Medicine

## 2015-05-15 DIAGNOSIS — Z471 Aftercare following joint replacement surgery: Secondary | ICD-10-CM | POA: Diagnosis not present

## 2015-05-15 DIAGNOSIS — Z96651 Presence of right artificial knee joint: Secondary | ICD-10-CM | POA: Diagnosis not present

## 2015-05-16 ENCOUNTER — Other Ambulatory Visit: Payer: Self-pay | Admitting: Internal Medicine

## 2015-05-17 DIAGNOSIS — Z96651 Presence of right artificial knee joint: Secondary | ICD-10-CM | POA: Diagnosis not present

## 2015-05-19 DIAGNOSIS — Z96651 Presence of right artificial knee joint: Secondary | ICD-10-CM | POA: Diagnosis not present

## 2015-05-24 DIAGNOSIS — Z96651 Presence of right artificial knee joint: Secondary | ICD-10-CM | POA: Diagnosis not present

## 2015-05-26 DIAGNOSIS — Z96651 Presence of right artificial knee joint: Secondary | ICD-10-CM | POA: Diagnosis not present

## 2015-05-31 DIAGNOSIS — Z96651 Presence of right artificial knee joint: Secondary | ICD-10-CM | POA: Diagnosis not present

## 2015-06-02 DIAGNOSIS — Z96651 Presence of right artificial knee joint: Secondary | ICD-10-CM | POA: Diagnosis not present

## 2015-06-07 DIAGNOSIS — Z96651 Presence of right artificial knee joint: Secondary | ICD-10-CM | POA: Diagnosis not present

## 2015-06-09 DIAGNOSIS — Z96651 Presence of right artificial knee joint: Secondary | ICD-10-CM | POA: Diagnosis not present

## 2015-06-15 DIAGNOSIS — Z96651 Presence of right artificial knee joint: Secondary | ICD-10-CM | POA: Diagnosis not present

## 2015-06-17 DIAGNOSIS — Z96651 Presence of right artificial knee joint: Secondary | ICD-10-CM | POA: Diagnosis not present

## 2015-06-17 DIAGNOSIS — Z23 Encounter for immunization: Secondary | ICD-10-CM | POA: Diagnosis not present

## 2015-06-22 DIAGNOSIS — Z96651 Presence of right artificial knee joint: Secondary | ICD-10-CM | POA: Diagnosis not present

## 2015-06-24 DIAGNOSIS — Z96651 Presence of right artificial knee joint: Secondary | ICD-10-CM | POA: Diagnosis not present

## 2015-06-28 DIAGNOSIS — Z96651 Presence of right artificial knee joint: Secondary | ICD-10-CM | POA: Diagnosis not present

## 2015-06-30 DIAGNOSIS — Z96651 Presence of right artificial knee joint: Secondary | ICD-10-CM | POA: Diagnosis not present

## 2015-07-05 DIAGNOSIS — Z96651 Presence of right artificial knee joint: Secondary | ICD-10-CM | POA: Diagnosis not present

## 2015-07-07 DIAGNOSIS — Z96651 Presence of right artificial knee joint: Secondary | ICD-10-CM | POA: Diagnosis not present

## 2015-07-12 DIAGNOSIS — Z96651 Presence of right artificial knee joint: Secondary | ICD-10-CM | POA: Diagnosis not present

## 2015-07-14 DIAGNOSIS — Z96651 Presence of right artificial knee joint: Secondary | ICD-10-CM | POA: Diagnosis not present

## 2015-08-01 ENCOUNTER — Ambulatory Visit (INDEPENDENT_AMBULATORY_CARE_PROVIDER_SITE_OTHER): Payer: Medicare Other | Admitting: Internal Medicine

## 2015-08-01 ENCOUNTER — Encounter: Payer: Self-pay | Admitting: Internal Medicine

## 2015-08-01 VITALS — BP 132/80 | HR 79 | Temp 97.8°F | Ht 63.0 in | Wt 224.0 lb

## 2015-08-01 DIAGNOSIS — Z0000 Encounter for general adult medical examination without abnormal findings: Secondary | ICD-10-CM

## 2015-08-01 DIAGNOSIS — I10 Essential (primary) hypertension: Secondary | ICD-10-CM | POA: Diagnosis not present

## 2015-08-01 DIAGNOSIS — Z6841 Body Mass Index (BMI) 40.0 and over, adult: Secondary | ICD-10-CM

## 2015-08-01 DIAGNOSIS — Z1211 Encounter for screening for malignant neoplasm of colon: Secondary | ICD-10-CM

## 2015-08-01 DIAGNOSIS — Z7189 Other specified counseling: Secondary | ICD-10-CM

## 2015-08-01 NOTE — Progress Notes (Signed)
Subjective:    Patient ID: Brooke Goodman, female    DOB: 09/14/1940, 74 y.o.   MRN: [id number]  HPI Here for Medicare wellness and follow up of chronic medical conditions Reviewed form and advanced directives Reviewed other doctors. [facility]--ortho. [facility] for opto No tobacco or alcohol Vision and hearing are okay Trying to get into regular exercise now No falls No depression or anhedonia Independent with instrumental ADLs No sig cognitive issues  Right TKR 3 months ago Has been improving Walking with cane--when out of house Back to driving and doing all instrumental ADLs Just finished PT---trying to start HEP  No other concerns No chest pain No SOB No dizziness or syncope No edema No palpitations  Ongoing urinary incontinence Uses pad--regular voiding, etc  Only uses cream for psoriasis Hasn't been bad lately  Current Outpatient Prescriptions on File Prior to Visit  Medication Sig Dispense Refill  . acetaminophen (TYLENOL) 650 MG CR tablet Take 650 mg by mouth 3 (three) times daily.      . Cholecalciferol (VITAMIN D-3) 1000 UNITS CAPS Take 1,000 Units by mouth daily.     . clobetasol cream (TEMOVATE) [id number] % Apply 1 application topically 2 (two) times daily. (Patient taking differently: Apply 1 application topically 2 (two) times daily as needed. ) 60 g 1  . Multiple Vitamin (MULTIVITAMIN) tablet Take 1 tablet by mouth daily.    . traMADol (ULTRAM) 50 MG tablet Take 1-2 tablets (50-100 mg total) by mouth every 4 (four) hours as needed for moderate pain. 30 tablet 0  . triamterene-hydrochlorothiazide (MAXZIDE-25) 37.5-25 MG per tablet Take 1 tablet by mouth  daily 90 tablet 3   No current facility-administered medications on file prior to visit.    No Known Allergies  Past Medical History  Diagnosis Date  . Diverticulitis   . Hypertension   . Psoriasis   . Urinary incontinence   . Osteoarthritis   . Restless leg syndrome     Past Surgical History    Procedure Laterality Date  . Vaginal delivery      x3  . Tendon release  07/98    right thumb  . Total knee arthroplasty Right 04/27/2015    Procedure: TOTAL KNEE ARTHROPLASTY;  Surgeon: Dereck Leep, MD;  Location: [facility] ORS;  Service: Orthopedics;  Laterality: Right;    Family History  Problem Relation Age of Onset  . Coronary artery disease Sister   . Cancer Sister     breast  . Hypertension Neg Hx   . Diabetes Neg Hx     Social History   Social History  . Marital Status: Widowed    Spouse Name: N/A  . Number of Children: 3  . Years of Education: N/A   Occupational History  . [employer]     retired 2016   Social History Main Topics  . Smoking status: Never Smoker   . Smokeless tobacco: Never Used  . Alcohol Use: No  . Drug Use: No  . Sexual Activity: Not on file   Other Topics Concern  . Not on file   Social History Narrative   Widowed 2011   No living will   Would want daughter [name] to make health care decisions for her   Would want attempts at resuscitation   Probably would accept tube feeds   Review of Systems Appetite is good Trying to be careful with eating--weight down 6# Sleeps well Wears seat belt Teeth are okay-- dentist irregularly (Dr Dory Larsen in  the past) Some other arthritic pain--hip and shoulders--just uses tylenol and advil. Still has last of tramadol. No suspicious lesions Bowels are fine    Objective:   Physical Exam  Constitutional: She is oriented to person, place, and time. She appears well-developed and well-nourished. No distress.  HENT:  Mouth/Throat: Oropharynx is clear and moist. No oropharyngeal exudate.  Neck: Normal range of motion. Neck supple. No thyromegaly present.  Cardiovascular: Normal rate, regular rhythm, normal heart sounds and intact distal pulses.  Exam reveals no gallop.   No murmur heard. Pulmonary/Chest: Effort normal and breath sounds normal. No respiratory distress. She has no wheezes. She has no  rales.  Abdominal: Soft. There is no tenderness.  Musculoskeletal: She exhibits no edema or tenderness.  Lymphadenopathy:    She has no cervical adenopathy.  Neurological: She is alert and oriented to person, place, and time.  President-- "Quay Burow, [place]" [phone] [id number] Recall 3/3  Skin: No rash noted. No erythema.  Psychiatric: She has a normal mood and affect. Her behavior is normal.          Assessment & Plan:

## 2015-08-01 NOTE — Progress Notes (Signed)
Pre visit review using our clinic review tool, if applicable. No additional management support is needed unless otherwise documented below in the visit note. 

## 2015-08-01 NOTE — Assessment & Plan Note (Signed)
See social history Blank forms given 

## 2015-08-01 NOTE — Patient Instructions (Signed)
Your mammogram is due in January, 2017. Please pick up the fecal test kit--do it, and send it back in the mail.

## 2015-08-01 NOTE — Assessment & Plan Note (Signed)
Tries to eat healthy Working on increased activity with her knee fixed

## 2015-08-01 NOTE — Assessment & Plan Note (Signed)
I have personally reviewed the Medicare Annual Wellness questionnaire and have noted 1. The patient's medical and social history 2. Their use of alcohol, tobacco or illicit drugs 3. Their current medications and supplements 4. The patient's functional ability including ADL's, fall risks, home safety risks and hearing or visual             impairment. 5. Diet and physical activities 6. Evidence for depression or mood disorders  The patients weight, height, BMI and visual acuity have been recorded in the chart I have made referrals, counseling and provided education to the patient based review of the above and I have provided the pt with a written personalized care plan for preventive services.  I have provided you with a copy of your personalized plan for preventive services. Please take the time to review along with your updated medication list.  mammo due 1/17 Will do fecal immunoassay UTD on imms

## 2015-08-01 NOTE — Assessment & Plan Note (Signed)
BP Readings from Last 3 Encounters:  08/01/15 132/80  04/30/15 137/75  04/13/15 110/76   Good control Renal function okay recently--skip blood work

## 2015-08-16 ENCOUNTER — Encounter: Payer: Self-pay | Admitting: *Deleted

## 2015-08-16 ENCOUNTER — Other Ambulatory Visit (INDEPENDENT_AMBULATORY_CARE_PROVIDER_SITE_OTHER): Payer: Medicare Other

## 2015-08-16 DIAGNOSIS — Z1211 Encounter for screening for malignant neoplasm of colon: Secondary | ICD-10-CM

## 2015-08-16 LAB — FECAL OCCULT BLOOD, IMMUNOCHEMICAL: Fecal Occult Bld: NEGATIVE

## 2015-09-27 ENCOUNTER — Ambulatory Visit (INDEPENDENT_AMBULATORY_CARE_PROVIDER_SITE_OTHER): Payer: Medicare Other

## 2015-09-27 DIAGNOSIS — Z23 Encounter for immunization: Secondary | ICD-10-CM

## 2015-10-20 DIAGNOSIS — Z96651 Presence of right artificial knee joint: Secondary | ICD-10-CM | POA: Diagnosis not present

## 2015-10-21 ENCOUNTER — Other Ambulatory Visit: Payer: Self-pay | Admitting: Internal Medicine

## 2015-10-21 DIAGNOSIS — Z1231 Encounter for screening mammogram for malignant neoplasm of breast: Secondary | ICD-10-CM

## 2015-10-24 ENCOUNTER — Other Ambulatory Visit: Payer: Self-pay | Admitting: Internal Medicine

## 2015-10-24 ENCOUNTER — Ambulatory Visit
Admission: RE | Admit: 2015-10-24 | Discharge: 2015-10-24 | Disposition: A | Discharge status: Home / Self Care | Payer: Medicare Other | Source: Ambulatory Visit | Attending: Internal Medicine | Admitting: Internal Medicine

## 2015-10-24 DIAGNOSIS — Z1231 Encounter for screening mammogram for malignant neoplasm of breast: Secondary | ICD-10-CM | POA: Insufficient documentation

## 2015-10-26 ENCOUNTER — Encounter: Payer: Self-pay | Admitting: *Deleted

## 2015-12-19 ENCOUNTER — Ambulatory Visit: Payer: Self-pay | Admitting: Internal Medicine

## 2016-04-09 ENCOUNTER — Telehealth: Payer: Self-pay | Admitting: Internal Medicine

## 2016-04-09 ENCOUNTER — Ambulatory Visit: Payer: Self-pay | Admitting: Family Medicine

## 2016-04-09 ENCOUNTER — Inpatient Hospital Stay
Admission: EM | Admit: 2016-04-09 | Discharge: 2016-04-13 | DRG: 372 | Disposition: A | Discharge status: Home / Self Care | Payer: Medicare Other | Attending: Internal Medicine | Admitting: Internal Medicine

## 2016-04-09 ENCOUNTER — Emergency Department: Payer: Medicare Other

## 2016-04-09 DIAGNOSIS — E876 Hypokalemia: Secondary | ICD-10-CM | POA: Diagnosis present

## 2016-04-09 DIAGNOSIS — I959 Hypotension, unspecified: Secondary | ICD-10-CM | POA: Diagnosis present

## 2016-04-09 DIAGNOSIS — G2581 Restless legs syndrome: Secondary | ICD-10-CM | POA: Diagnosis not present

## 2016-04-09 DIAGNOSIS — K529 Noninfective gastroenteritis and colitis, unspecified: Secondary | ICD-10-CM | POA: Diagnosis present

## 2016-04-09 DIAGNOSIS — I10 Essential (primary) hypertension: Secondary | ICD-10-CM | POA: Diagnosis not present

## 2016-04-09 DIAGNOSIS — N179 Acute kidney failure, unspecified: Secondary | ICD-10-CM | POA: Diagnosis not present

## 2016-04-09 DIAGNOSIS — L409 Psoriasis, unspecified: Secondary | ICD-10-CM | POA: Diagnosis present

## 2016-04-09 DIAGNOSIS — Z8249 Family history of ischemic heart disease and other diseases of the circulatory system: Secondary | ICD-10-CM | POA: Diagnosis not present

## 2016-04-09 DIAGNOSIS — Z79899 Other long term (current) drug therapy: Secondary | ICD-10-CM

## 2016-04-09 DIAGNOSIS — Z96651 Presence of right artificial knee joint: Secondary | ICD-10-CM | POA: Diagnosis not present

## 2016-04-09 DIAGNOSIS — I248 Other forms of acute ischemic heart disease: Secondary | ICD-10-CM | POA: Diagnosis present

## 2016-04-09 DIAGNOSIS — E860 Dehydration: Secondary | ICD-10-CM | POA: Diagnosis present

## 2016-04-09 DIAGNOSIS — K5100 Ulcerative (chronic) pancolitis without complications: Secondary | ICD-10-CM | POA: Diagnosis not present

## 2016-04-09 DIAGNOSIS — R32 Unspecified urinary incontinence: Secondary | ICD-10-CM | POA: Diagnosis present

## 2016-04-09 DIAGNOSIS — A0472 Enterocolitis due to Clostridium difficile, not specified as recurrent: Secondary | ICD-10-CM

## 2016-04-09 DIAGNOSIS — D72829 Elevated white blood cell count, unspecified: Secondary | ICD-10-CM | POA: Diagnosis not present

## 2016-04-09 DIAGNOSIS — M1990 Unspecified osteoarthritis, unspecified site: Secondary | ICD-10-CM | POA: Diagnosis present

## 2016-04-09 DIAGNOSIS — A047 Enterocolitis due to Clostridium difficile: Principal | ICD-10-CM | POA: Diagnosis present

## 2016-04-09 DIAGNOSIS — R109 Unspecified abdominal pain: Secondary | ICD-10-CM | POA: Diagnosis not present

## 2016-04-09 DIAGNOSIS — R188 Other ascites: Secondary | ICD-10-CM | POA: Diagnosis not present

## 2016-04-09 LAB — CBC
HEMATOCRIT: 42.3 % (ref 35.0–47.0)
HEMOGLOBIN: 14.6 g/dL (ref 12.0–16.0)
MCH: 29.8 pg (ref 26.0–34.0)
MCHC: 34.4 g/dL (ref 32.0–36.0)
MCV: 86.6 fL (ref 80.0–100.0)
Platelets: 459 10*3/uL — ABNORMAL HIGH (ref 150–440)
RBC: 4.88 MIL/uL (ref 3.80–5.20)
RDW: 12.6 % (ref 11.5–14.5)
WBC: 43.9 10*3/uL — ABNORMAL HIGH (ref 3.6–11.0)

## 2016-04-09 LAB — URINALYSIS COMPLETE WITH MICROSCOPIC (ARMC ONLY)
BACTERIA UA: NONE SEEN
Bilirubin Urine: NEGATIVE
Glucose, UA: NEGATIVE mg/dL
Ketones, ur: NEGATIVE mg/dL
LEUKOCYTES UA: NEGATIVE
NITRITE: NEGATIVE
PH: 5 (ref 5.0–8.0)
PROTEIN: NEGATIVE mg/dL
SPECIFIC GRAVITY, URINE: 1.032 — AB (ref 1.005–1.030)
Squamous Epithelial / LPF: NONE SEEN

## 2016-04-09 LAB — COMPREHENSIVE METABOLIC PANEL
ALBUMIN: 2.5 g/dL — AB (ref 3.5–5.0)
ALT: 23 U/L (ref 14–54)
ANION GAP: 15 (ref 5–15)
AST: 25 U/L (ref 15–41)
Alkaline Phosphatase: 136 U/L — ABNORMAL HIGH (ref 38–126)
BILIRUBIN TOTAL: 0.7 mg/dL (ref 0.3–1.2)
BUN: 55 mg/dL — ABNORMAL HIGH (ref 6–20)
CO2: 25 mmol/L (ref 22–32)
Calcium: 8.5 mg/dL — ABNORMAL LOW (ref 8.9–10.3)
Chloride: 92 mmol/L — ABNORMAL LOW (ref 101–111)
Creatinine, Ser: 1.5 mg/dL — ABNORMAL HIGH (ref 0.44–1.00)
GFR calc Af Amer: 38 mL/min — ABNORMAL LOW (ref >60)
GFR calc non Af Amer: 33 mL/min — ABNORMAL LOW (ref >60)
GLUCOSE: 168 mg/dL — AB (ref 65–99)
POTASSIUM: 3.4 mmol/L — AB (ref 3.5–5.1)
SODIUM: 132 mmol/L — AB (ref 135–145)
TOTAL PROTEIN: 6.5 g/dL (ref 6.5–8.1)

## 2016-04-09 LAB — GASTROINTESTINAL PANEL BY PCR, STOOL (REPLACES STOOL CULTURE)
Adenovirus F40/41: NOT DETECTED
Astrovirus: NOT DETECTED
CYCLOSPORA CAYETANENSIS: NOT DETECTED
Campylobacter species: NOT DETECTED
Cryptosporidium: NOT DETECTED
E. COLI O157: NOT DETECTED
ENTAMOEBA HISTOLYTICA: NOT DETECTED
Enteroaggregative E coli (EAEC): NOT DETECTED
Enteropathogenic E coli (EPEC): NOT DETECTED
Enterotoxigenic E coli (ETEC): NOT DETECTED
Giardia lamblia: NOT DETECTED
Norovirus GI/GII: NOT DETECTED
Plesimonas shigelloides: NOT DETECTED
ROTAVIRUS A: NOT DETECTED
SALMONELLA SPECIES: NOT DETECTED
SAPOVIRUS (I, II, IV, AND V): NOT DETECTED
SHIGA LIKE TOXIN PRODUCING E COLI (STEC): NOT DETECTED
SHIGELLA/ENTEROINVASIVE E COLI (EIEC): NOT DETECTED
VIBRIO CHOLERAE: NOT DETECTED
VIBRIO SPECIES: NOT DETECTED
Yersinia enterocolitica: NOT DETECTED

## 2016-04-09 LAB — LIPASE, BLOOD: Lipase: <10 U/L — ABNORMAL LOW (ref 11–51)

## 2016-04-09 LAB — DIFFERENTIAL
Basophils Absolute: 0.3 10*3/uL — ABNORMAL HIGH (ref 0–0.1)
Basophils Relative: 1 %
EOS PCT: 0 %
Eosinophils Absolute: 0.1 10*3/uL (ref 0–0.7)
LYMPHS ABS: 1.1 10*3/uL (ref 1.0–3.6)
LYMPHS PCT: 3 %
MONO ABS: 2 10*3/uL — AB (ref 0.2–0.9)
Monocytes Relative: 5 %
NEUTROS ABS: 39 10*3/uL — AB (ref 1.4–6.5)
NEUTROS PCT: 91 %

## 2016-04-09 LAB — TROPONIN I: Troponin I: 0.22 ng/mL (ref <0.03)

## 2016-04-09 LAB — C DIFFICILE QUICK SCREEN W PCR REFLEX
C Diff antigen: POSITIVE — AB
C Diff interpretation: DETECTED
C Diff toxin: POSITIVE — AB

## 2016-04-09 LAB — LACTIC ACID, PLASMA
LACTIC ACID, VENOUS: 2.9 mmol/L — AB (ref 0.5–1.9)
Lactic Acid, Venous: 1.5 mmol/L (ref 0.5–1.9)

## 2016-04-09 MED ORDER — SODIUM CHLORIDE 0.9 % IV BOLUS (SEPSIS)
500.0000 mL | Freq: Once | INTRAVENOUS | Status: AC
  Administered 2016-04-09: 500 mL via INTRAVENOUS

## 2016-04-09 MED ORDER — IOPAMIDOL (ISOVUE-300) INJECTION 61%
80.0000 mL | Freq: Once | INTRAVENOUS | Status: AC | PRN
  Administered 2016-04-09: 80 mL via INTRAVENOUS

## 2016-04-09 MED ORDER — ONDANSETRON HCL 4 MG/2ML IJ SOLN: 4.0000 mg | Freq: Four times a day (QID) | INTRAMUSCULAR | Status: DC | PRN

## 2016-04-09 MED ORDER — ONDANSETRON HCL 4 MG PO TABS: 4.0000 mg | ORAL_TABLET | Freq: Four times a day (QID) | ORAL | Status: DC | PRN

## 2016-04-09 MED ORDER — CLOBETASOL PROPIONATE 0.05 % EX CREA
1.0000 "application " | TOPICAL_CREAM | Freq: Two times a day (BID) | CUTANEOUS | Status: DC
  Administered 2016-04-09 – 2016-04-13 (×6): 1 via TOPICAL
  Filled 2016-04-09: qty 15

## 2016-04-09 MED ORDER — PIPERACILLIN-TAZOBACTAM 3.375 G IVPB 30 MIN
3.3750 g | Freq: Once | INTRAVENOUS | Status: AC
Start: 2016-04-09 — End: 2016-04-09
  Administered 2016-04-09: 3.375 g via INTRAVENOUS
  Filled 2016-04-09: qty 50

## 2016-04-09 MED ORDER — ENOXAPARIN SODIUM 40 MG/0.4ML ~~LOC~~ SOLN
40.0000 mg | Freq: Two times a day (BID) | SUBCUTANEOUS | Status: DC
  Administered 2016-04-09 – 2016-04-13 (×8): 40 mg via SUBCUTANEOUS
  Filled 2016-04-09 (×8): qty 0.4

## 2016-04-09 MED ORDER — HYDROCODONE-ACETAMINOPHEN 5-325 MG PO TABS: 1.0000 | ORAL_TABLET | ORAL | Status: DC | PRN

## 2016-04-09 MED ORDER — ACETAMINOPHEN 325 MG PO TABS: 650.0000 mg | ORAL_TABLET | Freq: Four times a day (QID) | ORAL | Status: DC | PRN

## 2016-04-09 MED ORDER — SODIUM CHLORIDE 0.9 % IV BOLUS (SEPSIS)
1000.0000 mL | Freq: Once | INTRAVENOUS | Status: AC
  Administered 2016-04-09: 1000 mL via INTRAVENOUS

## 2016-04-09 MED ORDER — TRAMADOL HCL 50 MG PO TABS: 50.0000 mg | ORAL_TABLET | ORAL | Status: DC | PRN

## 2016-04-09 MED ORDER — MORPHINE SULFATE (PF) 2 MG/ML IV SOLN: 1.0000 mg | INTRAVENOUS | Status: DC | PRN

## 2016-04-09 MED ORDER — DIATRIZOATE MEGLUMINE & SODIUM 66-10 % PO SOLN
15.0000 mL | Freq: Once | ORAL | Status: AC
  Administered 2016-04-09: 15 mL via ORAL

## 2016-04-09 MED ORDER — CIPROFLOXACIN IN D5W 400 MG/200ML IV SOLN
400.0000 mg | Freq: Two times a day (BID) | INTRAVENOUS | Status: DC
  Administered 2016-04-10: 400 mg via INTRAVENOUS
  Filled 2016-04-09 (×3): qty 200

## 2016-04-09 MED ORDER — ACETAMINOPHEN 650 MG RE SUPP: 650.0000 mg | Freq: Four times a day (QID) | RECTAL | Status: DC | PRN

## 2016-04-09 MED ORDER — METRONIDAZOLE 500 MG PO TABS
500.0000 mg | ORAL_TABLET | Freq: Three times a day (TID) | ORAL | Status: DC
  Administered 2016-04-09 – 2016-04-10 (×2): 500 mg via ORAL
  Filled 2016-04-09 (×3): qty 1

## 2016-04-09 MED ORDER — SODIUM CHLORIDE 0.9 % IV SOLN
Freq: Once | INTRAVENOUS | Status: AC
  Administered 2016-04-09: 1000 mL via INTRAVENOUS

## 2016-04-09 MED ORDER — ADULT MULTIVITAMIN W/MINERALS CH
1.0000 | ORAL_TABLET | Freq: Every day | ORAL | Status: DC
  Administered 2016-04-10 – 2016-04-13 (×4): 1 via ORAL
  Filled 2016-04-09 (×4): qty 1

## 2016-04-09 NOTE — ED Notes (Signed)
abso neuto count 39 , MD notifid

## 2016-04-09 NOTE — ED Notes (Signed)
RN tried to call report was given message that receiving had to call her back because she was getting report at this time

## 2016-04-09 NOTE — Progress Notes (Signed)
Anticoagulation monitoring(Lovenox):  75 yo  Female  ordered Lovenox 40 mg Q24h  Filed Weights   04/09/16 1319  Weight: 232 lb (105.2 kg)   BMI 41.2    Lab Results  Component Value Date   CREATININE 1.50 (H) 04/09/2016   CREATININE 0.84 04/28/2015   CREATININE 0.88 04/13/2015   Estimated Creatinine Clearance: 37.6 mL/min (by C-G formula based on SCr of 1.5 mg/dL). Hemoglobin & Hematocrit     Component Value Date/Time   HGB 14.6 04/09/2016 1335   HCT 42.3 04/09/2016 1335     Per Protocol for Patient with estCrcl > 30 ml/min and BMI > 40, will transition to Lovenox 40 mg Q12h.

## 2016-04-09 NOTE — H&P (Signed)
[facility] [place] NAME: Brooke Goodman    MR#:  [id number]  DATE OF BIRTH:  1941-03-28  DATE OF ADMISSION:  04/09/2016  PRIMARY CARE PHYSICIAN: Viviana Simpler, MD   REQUESTING/REFERRING PHYSICIAN: Dr. Wallace Cullens M.D.   CHIEF COMPLAINT:   Chief Complaint  Patient presents with  . Abdominal Pain  . Diarrhea    HISTORY OF PRESENT ILLNESS: Brooke Goodman  is a 75 y.o. female with a known history of  Hypertension, osteoarthritis and diverticulitis in the past presents with abdominal pain and diarrhea. Patient reports that she's been having diffuse abdominal pain ongoing for 3 weeks. Initially started in the lower abdomen now diffuse. She states that the pain sometimes dull and sometimes sharp. Along with the pain she's been having on and off diarrhea. She started taking Imodium but that did not help. She came with the ER with these symptoms CT scan of the abdomen shows diffuse colitis. Her WBC count was elevated in the 40,000 range. She does report that she was on antibiotics and beginning of June for tooth related issues for total of 14 days. She has not had any fevers chills. No chest pain or shortness of breath PAST MEDICAL HISTORY:   Past Medical History:  Diagnosis Date  . Diverticulitis   . Hypertension   . Osteoarthritis   . Psoriasis   . Restless leg syndrome   . Urinary incontinence     PAST SURGICAL HISTORY: Past Surgical History:  Procedure Laterality Date  . TENDON RELEASE  07/98   right thumb  . 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 Smoking status: Never Smoker  . Smokeless tobacco: Never Used  . Alcohol use No    FAMILY HISTORY:  Family History  Problem Relation Age of Onset  .  Coronary artery disease Sister   . Cancer Sister     breast  . Hypertension Neg Hx   . Diabetes Neg Hx     DRUG ALLERGIES: No Known Allergies  REVIEW OF SYSTEMS:   CONSTITUTIONAL: No fever, fatigue or weakness.  EYES: No blurred or double vision.  EARS, NOSE, AND THROAT: No tinnitus or ear pain.  RESPIRATORY: No cough, shortness of breath, wheezing or hemoptysis.  CARDIOVASCULAR: No chest pain, orthopnea, edema.  GASTROINTESTINAL: No nausea, vomiting, Positive diarrhea or positive abdominal pain.  GENITOURINARY: No dysuria, hematuria.  ENDOCRINE: No polyuria, nocturia,  HEMATOLOGY: No anemia, easy bruising or bleeding SKIN: No rash or lesion. MUSCULOSKELETAL: No joint pain or arthritis.   NEUROLOGIC: No tingling, numbness, weakness.  PSYCHIATRY: No anxiety or depression.   MEDICATIONS AT HOME:  Prior to Admission medications   Medication Sig Start Date End Date Taking? Authorizing Provider  acetaminophen (TYLENOL) 650 MG CR tablet Take 650 mg by mouth 3 (three) times daily.      Historical Provider, MD  Cholecalciferol (VITAMIN D-3) 1000 UNITS CAPS Take 1,000 Units by mouth daily.     Historical Provider, MD  clobetasol cream (TEMOVATE) [id number] % Apply 1 application topically 2 (two) times daily. Patient taking differently: Apply 1 application topically 2 (two) times daily as needed.  07/22/13   Venia Carbon, MD  Multiple Vitamin (MULTIVITAMIN) tablet Take 1 tablet by mouth  daily.    Historical Provider, MD  traMADol (ULTRAM) 50 MG tablet Take 1-2 tablets (50-100 mg total) by mouth every 4 (four) hours as needed for moderate pain. 04/29/15   Watt Climes, PA  triamterene-hydrochlorothiazide [facility]) 37.5-25 MG per tablet Take 1 tablet by mouth  daily 05/17/15   Venia Carbon, MD      PHYSICAL EXAMINATION:   VITAL SIGNS: Blood pressure 95/74, pulse (!) 113, temperature 98.7 F (37.1 C), temperature source Oral, resp. rate 16, height 5\' 3"  (1.6 m), weight 105.2 kg (232 lb),  SpO2 97 %.  GENERAL:  75 y.o.-year-old patient lying in the bed with no acute distress.  EYES: Pupils equal, round, reactive to light and accommodation. No scleral icterus. Extraocular muscles intact.  HEENT: Head atraumatic, normocephalic. Oropharynx and nasopharynx clear.  NECK:  Supple, no jugular venous distention. No thyroid enlargement, no tenderness.  LUNGS: Normal breath sounds bilaterally, no wheezing, rales,rhonchi or crepitation. No use of accessory muscles of respiration.  CARDIOVASCULAR: S1, S2 normal. No murmurs, rubs, or gallops.  ABDOMEN: Soft, Diffuse tenderness, nondistended. No guarding Bowel sounds present. No organomegaly or mass.  EXTREMITIES: No pedal edema, cyanosis, or clubbing.  NEUROLOGIC: Cranial nerves II through XII are intact. Muscle strength 5/5 in all extremities. Sensation intact. Gait not checked.  PSYCHIATRIC: The patient is alert and oriented x 3.  SKIN: No obvious rash, lesion, or ulcer.   LABORATORY PANEL:   CBC  Recent Labs Lab 04/09/16 1335  WBC 43.9*  HGB 14.6  HCT 42.3  PLT 459*  MCV 86.6  MCH 29.8  MCHC 34.4  RDW 12.6  LYMPHSABS 1.1  MONOABS 2.0*  EOSABS 0.1  BASOSABS 0.3*   ------------------------------------------------------------------------------------------------------------------  Chemistries   Recent Labs Lab 04/09/16 1335  NA 132*  K 3.4*  CL 92*  CO2 25  GLUCOSE 168*  BUN 55*  CREATININE 1.50*  CALCIUM 8.5*  AST 25  ALT 23  ALKPHOS 136*  BILITOT 0.7   ------------------------------------------------------------------------------------------------------------------ estimated creatinine clearance is 37.6 mL/min (by C-G formula based on SCr of 1.5 mg/dL). ------------------------------------------------------------------------------------------------------------------ No results for input(s): TSH, T4TOTAL, T3FREE, THYROIDAB in the last 72 hours.  Invalid input(s): FREET3   Coagulation profile No results  for input(s): INR, PROTIME in the last 168 hours. ------------------------------------------------------------------------------------------------------------------- No results for input(s): DDIMER in the last 72 hours. -------------------------------------------------------------------------------------------------------------------  Cardiac Enzymes  Recent Labs Lab 04/09/16 1334  TROPONINI 0.22*   ------------------------------------------------------------------------------------------------------------------ Invalid input(s): POCBNP  ---------------------------------------------------------------------------------------------------------------  Urinalysis    Component Value Date/Time   COLORURINE YELLOW (A) 04/09/2016 1733   APPEARANCEUR HAZY (A) 04/09/2016 1733   LABSPEC 1.032 (H) 04/09/2016 1733   PHURINE 5.0 04/09/2016 1733   GLUCOSEU NEGATIVE 04/09/2016 1733   HGBUR 1+ (A) 04/09/2016 1733   BILIRUBINUR NEGATIVE 04/09/2016 1733   KETONESUR NEGATIVE 04/09/2016 1733   PROTEINUR NEGATIVE 04/09/2016 1733   NITRITE NEGATIVE 04/09/2016 1733   LEUKOCYTESUR NEGATIVE 04/09/2016 1733     RADIOLOGY: Ct Abdomen Pelvis W Contrast  Result Date: 04/09/2016 CLINICAL DATA:  Lower abdominal pain and diarrhea for 2 weeks. Loss of appetite. EXAM: CT ABDOMEN AND PELVIS WITH CONTRAST TECHNIQUE: Multidetector CT imaging of the abdomen and pelvis was performed using the standard protocol following bolus administration of intravenous contrast. CONTRAST:  24mL ISOVUE-300 IOPAMIDOL (ISOVUE-300) INJECTION 61% COMPARISON:  None. FINDINGS: Lower chest: No pulmonary nodules, pleural effusions, or infiltrates. Coronary artery calcifications are present. Hepatobiliary: There is focal fat attenuation adjacent to the falciform ligaments. Low-attenuation lesions within the right hepatic lobe  are most consistent with cysts, largest measuring 2.2 cm. The gallbladder is present. There is a small amount of  pericholecystic fluid. Pancreas: Normal in appearance. Spleen: Normal in appearance. Renal/Adrenal: Adrenal glands are normal in appearance. Kidneys are normal in appearance. No focal renal mass or hydronephrosis. Gastrointestinal tract: Small hiatal type hernia. Small bowel loops are normal in appearance. The appendix is well seen and has a normal appearance. There is diffusely abnormal colon. The wall of the colon is thickened and there is mucosal enhancement. These changes affect the entire colon from the cecum to the rectum. There are scattered diverticula. Reproductive/Pelvis: Uterus is present. No adnexal mass. There is a small amount of free pelvic fluid. Vascular/Lymphatic: Atherosclerotic calcification in the abdominal aorta. Musculoskeletal/Abdominal wall: Degenerative changes are identified in the lower thoracic and lumbar spine. Other: none IMPRESSION: 1. Pancolitis. Findings are consistent with infectious, inflammatory process. Less likely the findings could be related to ischemic causes. Although there are colonic diverticula, the process is in consistent with acute diverticulitis. 2. Small amount of pelvic ascites. 3. Pericholecystic fluid, likely secondary to the colitis rather than primary process involving the gallbladder. 4. Focal fatty infiltration of the liver. 5. Small liver cysts. 6. Hiatal hernia. 7. Normal appendix. Electronically Signed   By: Nolon Nations M.D.   On: 04/09/2016 17:19    EKG: Orders placed or performed during the hospital encounter of 04/09/16  . ED EKG  . ED EKG    IMPRESSION AND PLAN: Patient is a 75 year old white female being admitted with abdominal pain and pancolitis  1. Abdominal pain due to colitis Suspect C. Difficile Await C. difficile PCR We'll treat with oral Flagyl and IV Cipro GI consult  2. Acute renal failure Due to dehydration IV fluids  3. Leukocytosis Due to underlying infection colitis Follow WBC  4. Hypertension Currently  hypotensive IV fluids Hold blood pressure medications  5. Elevated troponin Suspect due to demand ischemia Follow troponin  6. Miscellaneous Lovenox for DVT prophylaxis     All the records are reviewed and case discussed with ED provider. Management plans discussed with the patient, family and they are in agreement.  CODE STATUS:    Code Status Orders        Start     Ordered   04/09/16 1815  Full code  Continuous     04/09/16 1815    Code Status History    Date Active Date Inactive Code Status Order ID Comments User Context   04/27/2015 12:27 PM 04/30/2015  4:38 PM Full Code [id number]  Dereck Leep, MD Inpatient       TOTAL TIME TAKING CARE OF THIS PATIENT:12minutes.    Dustin Flock M.D on 04/09/2016 at 6:19 PM  Between 7am to 6pm - Pager - [phone]  After 6pm go to [email or web address] - password [email or web address] [facility]  Office  [phone]  CC: Primary care physician; Viviana Simpler, MD

## 2016-04-09 NOTE — Telephone Encounter (Signed)
Please check on her tomorrow 

## 2016-04-09 NOTE — ED Notes (Addendum)
Lactic acid 2.9 per Lab , MD notified

## 2016-04-09 NOTE — ED Triage Notes (Signed)
Pt arrives to ER via POV c/o lower abdominal pain and diarrhea x 2 weeks. Pt reports loss of appetite as well. Pt alert and oriented X4, active, cooperative, pt in NAD. RR even and unlabored, color WNL.

## 2016-04-09 NOTE — Telephone Encounter (Signed)
Patient Name: Brooke Goodman  DOB: Jan 18, 1941    Initial Comment Caller states sister in law sick 2 wks w/diarrhea and stomach pain, very weak; wants to be   Nurse Assessment  Nurse: Leilani Merl, RN, Heather Date/Time (Eastern Time): 04/09/2016 11:57:33 AM  Confirm and document reason for call. If symptomatic, describe symptoms. You must click the next button to save text entered. ---Caller states sister in law sick 2 wks w/diarrhea and stomach pain, very weak; wants to be  Has the patient traveled out of the country within the last 30 days? ---Not Applicable  Does the patient have any new or worsening symptoms? ---Yes  Will a triage be completed? ---Yes  Related visit to physician within the last 2 weeks? ---No  Does the PT have any chronic conditions? (i.e. diabetes, asthma, etc.) ---Yes  List chronic conditions. ---See MR  Is this a behavioral health or substance abuse call? ---No     Guidelines    Guideline Title Affirmed Question Affirmed Notes  Diarrhea [1] SEVERE diarrhea (e.g., 7 or more times / day more than normal) AND [2] age > 60 years    Final Disposition User   See Physician within 4 Hours (or PCP triage) Leilani Merl, RN, Nira Conn    Comments  Appt with Dr. Phillip Heal at 3pm today.   Referrals  REFERRED TO PCP OFFICE   Disagree/Comply: Comply

## 2016-04-09 NOTE — ED Provider Notes (Signed)
[facility] Emergency Department Provider Note   ____________________________________________   I have reviewed the triage vital signs and the nursing notes.   HISTORY  Chief Complaint Abdominal Pain and Diarrhea   History limited by: Not Limited   HPI [place] is a 75 y.o. female who presents to the emergency department today because of concern for abdominal pain. The pain has been going on for roughly 2 weeks. It started in the lower abdomen but has been radiating into the upper and mid abdomen. The patient states that she has had decreased apetite and some nausea. Has had non bloody diarrhea. No vomiting. Was on antibiotics last month for dental issues. She denies any fevers.   Past Medical History:  Diagnosis Date  . Diverticulitis   . Hypertension   . Osteoarthritis   . Psoriasis   . Restless leg syndrome   . Urinary incontinence     Patient Active Problem List   Diagnosis Date Noted  . Advanced directives, counseling/discussion 07/23/2014  . BMI 40.0-44.9, adult ([place]) 07/22/2013  . Routine general medical examination at a health care facility 01/18/2012  . Psoriasis   . Essential hypertension, benign 11/03/2010  . DIVERTICULOSIS, COLON 11/03/2010  . OSTEOARTHRITIS 11/03/2010  . Urinary incontinence 11/03/2010    Past Surgical History:  Procedure Laterality Date  . TENDON RELEASE  07/98   right thumb  . 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 Authorizing Provider  acetaminophen (TYLENOL) 650 MG CR tablet Take 650 mg by mouth 3 (three) times daily.      Historical Provider, MD  Cholecalciferol (VITAMIN D-3) 1000 UNITS CAPS Take 1,000 Units by mouth daily.     Historical Provider, MD   clobetasol cream (TEMOVATE) [id number] % Apply 1 application topically 2 (two) times daily. Patient taking differently: Apply 1 application topically 2 (two) times daily as needed.  07/22/13   Venia Carbon, MD  Multiple Vitamin (MULTIVITAMIN) tablet Take 1 tablet by mouth daily.    Historical Provider, MD  traMADol (ULTRAM) 50 MG tablet Take 1-2 tablets (50-100 mg total) by mouth every 4 (four) hours as needed for moderate pain. 04/29/15   Watt Climes, PA  triamterene-hydrochlorothiazide [facility]) 37.5-25 MG per tablet Take 1 tablet by mouth  daily 05/17/15   Venia Carbon, MD    Allergies Review of patient's allergies indicates no known allergies.  Family History  Problem Relation Age of Onset  . Coronary artery disease Sister   . Cancer Sister     breast  . Hypertension Neg Hx   . Diabetes Neg Hx     Social History Social History  Substance Use Topics  . Smoking status: Never Smoker  . Smokeless tobacco: Never Used  . Alcohol use No    Review of Systems  Constitutional: Negative for fever. Cardiovascular: Negative for chest pain. Respiratory: Negative for shortness of breath. Gastrointestinal: Positive for abdominal pain and diarrhea. Genitourinary: Negative for dysuria. Musculoskeletal: Negative for back pain. Skin: Negative for rash. Neurological: Negative for headaches, focal weakness or numbness.   10-point ROS otherwise negative.  ____________________________________________   PHYSICAL EXAM:  VITAL SIGNS: ED Triage Vitals  Enc Vitals Group     BP 04/09/16 1319 95/74  Pulse Rate 04/09/16 1319 (!) 113     Resp 04/09/16 1319 16     Temp 04/09/16 1319 98.7 F (37.1 C)     Temp Source 04/09/16 1319 Oral     SpO2 04/09/16 1319 97 %     Weight 04/09/16 1319 232 lb (105.2 kg)     Height 04/09/16 1319 5\' 3"  (1.6 m)     Head Circumference --      Peak Flow --      Pain Score 04/09/16 1320 9   Constitutional: Alert and oriented. Well appearing and in no  distress. Eyes: Conjunctivae are normal. PERRL. Normal extraocular movements. ENT   Head: Normocephalic and atraumatic.   Nose: No congestion/rhinnorhea.   Mouth/Throat: Mucous membranes are moist.   Neck: No stridor. Hematological/Lymphatic/Immunilogical: No cervical lymphadenopathy. Cardiovascular: Normal rate, regular rhythm.  No murmurs, rubs, or gallops. Respiratory: Normal respiratory effort without tachypnea nor retractions. Breath sounds are clear and equal bilaterally. No wheezes/rales/rhonchi. Gastrointestinal: Soft and somewhat diffusely tender to palpitation, worse in the lower abdomen. No rebound. No guarding.  Genitourinary: Deferred Musculoskeletal: Normal range of motion in all extremities. No joint effusions.  No lower extremity tenderness nor edema. Neurologic:  Normal speech and language. No gross focal neurologic deficits are appreciated.  Skin:  Skin is warm, dry and intact. No rash noted. Psychiatric: Mood and affect are normal. Speech and behavior are normal. Patient exhibits appropriate insight and judgment.  ____________________________________________    LABS (pertinent positives/negatives)   Labs Reviewed  C DIFFICILE QUICK SCREEN W PCR REFLEX - Abnormal; Notable for the following:       Result Value   C Diff antigen POSITIVE (*)    C Diff toxin POSITIVE (*)    All other components within normal limits  LIPASE, BLOOD - Abnormal; Notable for the following:    Lipase <10 (*)    All other components within normal limits  COMPREHENSIVE METABOLIC PANEL - Abnormal; Notable for the following:    Sodium 132 (*)    Potassium 3.4 (*)    Chloride 92 (*)    Glucose, Bld 168 (*)    BUN 55 (*)    Creatinine, Ser 1.50 (*)    Calcium 8.5 (*)    Albumin 2.5 (*)    Alkaline Phosphatase 136 (*)    GFR calc non Af Amer 33 (*)    GFR calc Af Amer 38 (*)    All other components within normal limits  CBC - Abnormal; Notable for the following:    WBC  43.9 (*)    Platelets 459 (*)    All other components within normal limits  URINALYSIS COMPLETEWITH MICROSCOPIC ([facility] ONLY) - Abnormal; Notable for the following:    Color, Urine YELLOW (*)    APPearance HAZY (*)    Specific Gravity, Urine 1.032 (*)    Hgb urine dipstick 1+ (*)    All other components within normal limits  LACTIC ACID, PLASMA - Abnormal; Notable for the following:    Lactic Acid, Venous 2.9 (*)    All other components within normal limits  DIFFERENTIAL - Abnormal; Notable for the following:    Neutro Abs 39.0 (*)    Monocytes Absolute 2.0 (*)    Basophils Absolute 0.3 (*)    All other components within normal limits  TROPONIN I - Abnormal; Notable for the following:    Troponin I 0.22 (*)    All other components within normal limits  GASTROINTESTINAL PANEL BY PCR, STOOL (  REPLACES STOOL CULTURE)  CULTURE, BLOOD (ROUTINE X 2)  CULTURE, BLOOD (ROUTINE X 2)  URINE CULTURE  STOOL CULTURE  LACTIC ACID, PLASMA  CBC  BASIC METABOLIC PANEL  TROPONIN I  TROPONIN I     ____________________________________________   EKG  None  ____________________________________________    RADIOLOGY  CT abd/pel IMPRESSION: 1. Pancolitis. Findings are consistent with infectious, inflammatory process. Less likely the findings could be related to ischemic causes. Although there are colonic diverticula, the process is in consistent with acute diverticulitis. 2. Small amount of pelvic ascites. 3. Pericholecystic fluid, likely secondary to the colitis rather than primary process involving the gallbladder. 4. Focal fatty infiltration of the liver. 5. Small liver cysts. 6. Hiatal hernia. 7. Normal appendix.  ____________________________________________   PROCEDURES  Procedures  ____________________________________________   INITIAL IMPRESSION / ASSESSMENT AND PLAN / ED COURSE  Pertinent labs & imaging results that were available during my care of the patient were  reviewed by me and considered in my medical decision making (see chart for details).  Patient presented to the emergency department today because of concerns for abdominal pain and diarrhea for the past 2 weeks. On exam patient was tender somewhat diffusely throughout the abdomen. A CT scan was obtained which did show pancolitis. Patient also had significant leukocytosis. Patient was given fluids and broad-spectrum antibiotic. Patient will be admitted to the hospitalist service. ____________________________________________   FINAL CLINICAL IMPRESSION(S) / ED DIAGNOSES  Pancolotis  Note: This dictation was prepared with Dragon dictation. Any transcriptional errors that result from this process are unintentional    Nance Pear, MD 04/09/16 2128

## 2016-04-09 NOTE — ED Notes (Signed)
Pt placed on bedpan for stool collection, lab rejected collection due to wrong specimen container, will re collect

## 2016-04-09 NOTE — H&P (Signed)
Hp done earlier

## 2016-04-09 NOTE — Telephone Encounter (Signed)
Per cancellation note for appt with Dr Damita Dunnings on 04/09/16 pt went to walk in clinic.

## 2016-04-10 DIAGNOSIS — A047 Enterocolitis due to Clostridium difficile: Principal | ICD-10-CM

## 2016-04-10 DIAGNOSIS — A0472 Enterocolitis due to Clostridium difficile, not specified as recurrent: Secondary | ICD-10-CM

## 2016-04-10 LAB — CBC
HCT: 38.8 % (ref 35.0–47.0)
Hemoglobin: 13.2 g/dL (ref 12.0–16.0)
MCH: 29.6 pg (ref 26.0–34.0)
MCHC: 34 g/dL (ref 32.0–36.0)
MCV: 87 fL (ref 80.0–100.0)
PLATELETS: 353 10*3/uL (ref 150–440)
RBC: 4.46 MIL/uL (ref 3.80–5.20)
RDW: 12.4 % (ref 11.5–14.5)
WBC: 40.2 10*3/uL — AB (ref 3.6–11.0)

## 2016-04-10 LAB — TROPONIN I
Troponin I: 0.56 ng/mL (ref <0.03)
Troponin I: 0.2 ng/mL (ref <0.03)

## 2016-04-10 LAB — BASIC METABOLIC PANEL
ANION GAP: 12 (ref 5–15)
BUN: 41 mg/dL — ABNORMAL HIGH (ref 6–20)
CALCIUM: 7.6 mg/dL — AB (ref 8.9–10.3)
CO2: 23 mmol/L (ref 22–32)
Chloride: 98 mmol/L — ABNORMAL LOW (ref 101–111)
Creatinine, Ser: 1.06 mg/dL — ABNORMAL HIGH (ref 0.44–1.00)
GFR, EST AFRICAN AMERICAN: 58 mL/min — AB (ref >60)
GFR, EST NON AFRICAN AMERICAN: 50 mL/min — AB (ref >60)
Glucose, Bld: 125 mg/dL — ABNORMAL HIGH (ref 65–99)
POTASSIUM: 2.7 mmol/L — AB (ref 3.5–5.1)
SODIUM: 133 mmol/L — AB (ref 135–145)

## 2016-04-10 LAB — URINE CULTURE: CULTURE: NO GROWTH

## 2016-04-10 MED ORDER — POTASSIUM CHLORIDE CRYS ER 20 MEQ PO TBCR
40.0000 meq | EXTENDED_RELEASE_TABLET | Freq: Once | ORAL | Status: AC
  Administered 2016-04-10: 40 meq via ORAL
  Filled 2016-04-10: qty 2

## 2016-04-10 MED ORDER — VANCOMYCIN 50 MG/ML ORAL SOLUTION
125.0000 mg | Freq: Four times a day (QID) | ORAL | Status: DC
  Administered 2016-04-10 – 2016-04-13 (×14): 125 mg via ORAL
  Filled 2016-04-10 (×16): qty 2.5

## 2016-04-10 MED ORDER — POTASSIUM CHLORIDE CRYS ER 20 MEQ PO TBCR
20.0000 meq | EXTENDED_RELEASE_TABLET | Freq: Two times a day (BID) | ORAL | Status: DC
  Administered 2016-04-10 – 2016-04-13 (×7): 20 meq via ORAL
  Filled 2016-04-10 (×7): qty 1

## 2016-04-10 MED ORDER — METRONIDAZOLE 500 MG PO TABS
500.0000 mg | ORAL_TABLET | Freq: Three times a day (TID) | ORAL | Status: DC
  Administered 2016-04-10 – 2016-04-13 (×10): 500 mg via ORAL
  Filled 2016-04-10 (×10): qty 1

## 2016-04-10 NOTE — Telephone Encounter (Signed)
Left message to call office

## 2016-04-10 NOTE — Progress Notes (Signed)
[name] [place] NAME: Brooke Goodman    MR#:  [id number]  DATE OF BIRTH:  [birth year]  SUBJECTIVE:   Pt. Here due to abdominal pain, diarrhea and noted to have C. Diff colitis. Diarrhea hs improved.  Afebrile, hemodynamically stable.    REVIEW OF SYSTEMS:    Review of Systems  Constitutional: Negative for chills and fever.  HENT: Negative for congestion and tinnitus.   Eyes: Negative for blurred vision and double vision.  Respiratory: Negative for cough, shortness of breath and wheezing.   Cardiovascular: Negative for chest pain, orthopnea and PND.  Gastrointestinal: Positive for abdominal pain and diarrhea. Negative for nausea and vomiting.  Genitourinary: Negative for dysuria and hematuria.  Neurological: Negative for dizziness, sensory change and focal weakness.  All other systems reviewed and are negative.   Nutrition: Clear liquid Tolerating Diet: Yes Tolerating PT: Ambulatory     DRUG ALLERGIES:  No Known Allergies  VITALS:  Blood pressure 104/62, pulse 92, temperature 100 F (37.8 C), resp. rate 16, height 5\' 3"  (1.6 m), weight 105.2 kg (232 lb), SpO2 97 %.  PHYSICAL EXAMINATION:   Physical Exam  GENERAL:  75 y.o.-year-old patient lying in the bed in no acute distress.  EYES: Pupils equal, round, reactive to light and accommodation. No scleral icterus. Extraocular muscles intact.  HEENT: Head atraumatic, normocephalic. Oropharynx and nasopharynx clear.  NECK:  Supple, no jugular venous distention. No thyroid enlargement, no tenderness.  LUNGS: Normal breath sounds bilaterally, no wheezing, rales, rhonchi. No use of accessory muscles of respiration.  CARDIOVASCULAR: S1, S2 normal. No murmurs, rubs, or gallops.  ABDOMEN: Soft, Tender diffusely, no rebound, rigidity, nondistended. Bowel sounds present. No organomegaly or mass.  EXTREMITIES: No cyanosis, clubbing or edema b/l.    NEUROLOGIC: Cranial nerves II through  XII are intact. No focal Motor or sensory deficits b/l.   PSYCHIATRIC: The patient is alert and oriented x 3.  SKIN: No obvious rash, lesion, or ulcer.    LABORATORY PANEL:   CBC  Recent Labs Lab 04/10/16 0304  WBC 40.2*  HGB 13.2  HCT 38.8  PLT 353   ------------------------------------------------------------------------------------------------------------------  Chemistries   Recent Labs Lab 04/09/16 1335 04/10/16 0304  NA 132* 133*  K 3.4* 2.7*  CL 92* 98*  CO2 25 23  GLUCOSE 168* 125*  BUN 55* 41*  CREATININE 1.50* 1.06*  CALCIUM 8.5* 7.6*  AST 25  --   ALT 23  --   ALKPHOS 136*  --   BILITOT 0.7  --    ------------------------------------------------------------------------------------------------------------------  Cardiac Enzymes  Recent Labs Lab 04/10/16 0304  TROPONINI 0.20*   ------------------------------------------------------------------------------------------------------------------  RADIOLOGY:  Ct Abdomen Pelvis W Contrast  Result Date: 04/09/2016 CLINICAL DATA:  Lower abdominal pain and diarrhea for 2 weeks. Loss of appetite. EXAM: CT ABDOMEN AND PELVIS WITH CONTRAST TECHNIQUE: Multidetector CT imaging of the abdomen and pelvis was performed using the standard protocol following bolus administration of intravenous contrast. CONTRAST:  49mL ISOVUE-300 IOPAMIDOL (ISOVUE-300) INJECTION 61% COMPARISON:  None. FINDINGS: Lower chest: No pulmonary nodules, pleural effusions, or infiltrates. Coronary artery calcifications are present. Hepatobiliary: There is focal fat attenuation adjacent to the falciform ligaments. Low-attenuation lesions within the right hepatic lobe are most consistent with cysts, largest measuring 2.2 cm. The gallbladder is present. There is a small amount of pericholecystic fluid. Pancreas: Normal in appearance. Spleen: Normal in appearance. Renal/Adrenal: Adrenal glands are normal in appearance. Kidneys are normal in appearance. No  focal renal  mass or hydronephrosis. Gastrointestinal tract: Small hiatal type hernia. Small bowel loops are normal in appearance. The appendix is well seen and has a normal appearance. There is diffusely abnormal colon. The wall of the colon is thickened and there is mucosal enhancement. These changes affect the entire colon from the cecum to the rectum. There are scattered diverticula. Reproductive/Pelvis: Uterus is present. No adnexal mass. There is a small amount of free pelvic fluid. Vascular/Lymphatic: Atherosclerotic calcification in the abdominal aorta. Musculoskeletal/Abdominal wall: Degenerative changes are identified in the lower thoracic and lumbar spine. Other: none IMPRESSION: 1. Pancolitis. Findings are consistent with infectious, inflammatory process. Less likely the findings could be related to ischemic causes. Although there are colonic diverticula, the process is in consistent with acute diverticulitis. 2. Small amount of pelvic ascites. 3. Pericholecystic fluid, likely secondary to the colitis rather than primary process involving the gallbladder. 4. Focal fatty infiltration of the liver. 5. Small liver cysts. 6. Hiatal hernia. 7. Normal appendix. Electronically Signed   By: Nolon Nations M.D.   On: 04/09/2016 17:19     ASSESSMENT AND PLAN:   75 year old female hx of HTN, OA, Restless leg syndrome, Urinary Incontinence, Psoriasis, came into the  Due to abdominal pain, diarrhea and noted to have C. Diff colitis.   1. C. Diff colitis - cause of abdominal pain, Diarrhea.  - cont. Oral Vanco, Flagyl.  Improving.   - diarrhea improving.  Cont. Clear liquid diet.   2. Leukocytosis - due to C. Diff colitis.  - cont. W/ treatment and will monitor.   3. Acute Kidney Injury - due to diarrhea, dehydration.  - improving w/ fluids and will monitor.   4. Hypokalemia - due to diarrhea  Will replace and repeat level in a.m. Check Mg. Level.   5. Elevated Troponin - demand ischemia  secondary from dehydration and acute kidney injury. -Troponins have now trended down. Asymptomatic. We'll DC telemetry.    All the records are reviewed and case discussed with Care Management/Social Workerr. Management plans discussed with the patient, family and they are in agreement.  CODE STATUS: Full  DVT Prophylaxis: Lovenox  TOTAL TIME TAKING CARE OF THIS PATIENT: 30 minutes.   POSSIBLE D/C IN 2-3 DAYS, DEPENDING ON CLINICAL CONDITION.   Henreitta Leber M.D on 04/10/2016 at 1:57 PM  Between 7am to 6pm - Pager - [phone]  After 6pm go to [email or web address] - password [email or web address] [facility]  Office  [phone]  CC: Primary care physician; Viviana Simpler, MD

## 2016-04-10 NOTE — Progress Notes (Signed)
Paged MD michael diamond from amion K+ 2.7

## 2016-04-10 NOTE — Consult Note (Signed)
Lucilla Lame, MD [address] [place], [place] [address] Phone: [phone] Fax : [phone]  Consultation  Referring Provider:     No ref. provider found Primary Care Physician:  Viviana Simpler, MD Primary Gastroenterologist:  None.         Reason for Consultation:     C. Difficile colitis  Date of Admission:  04/09/2016 Date of Consultation:  04/10/2016         HPI:   Brooke Goodman is a 75 y.o. female who was admitted with a CT scan of pancolitis.  The patient had stool studies sent off and was found to have C. Difficile. Patient reports that she had diarrhea for 2 weeks but had so many things going on in her life she did not have time to have it checked out.  The patient also denies ever having a colonoscopy because she states her primary care provider does not believe in them.  The patient was also admitted with a white cell count of 43.  Repeat white cell count a few hours later was 40.  The patient states that she was having significant amounts of diarrhea and abdominal pain.  Then states that the abdominal pain was a crampy abdominal pain.  Past Medical History:  Diagnosis Date  . Diverticulitis   . Hypertension   . Osteoarthritis   . Psoriasis   . Restless leg syndrome   . Urinary incontinence     Past Surgical History:  Procedure Laterality Date  . TENDON RELEASE  07/98   right thumb  . 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 Authorizing Provider  clobetasol cream (TEMOVATE) [id number] % Apply 1 application topically 2 (two) times daily. Patient taking differently: Apply 1 application topically 2 (two) times daily as needed.  07/22/13  Yes Venia Carbon, MD  Multiple Vitamin (MULTIVITAMIN) tablet Take 1 tablet by  mouth daily.   Yes Historical Provider, MD  triamterene-hydrochlorothiazide (MAXZIDE-25) 37.5-25 MG per tablet Take 1 tablet by mouth  daily 05/17/15  Yes Venia Carbon, MD    Family History  Problem Relation Age of Onset  . Coronary artery disease Sister   . Cancer Sister     breast  . Hypertension Neg Hx   . Diabetes Neg Hx      Social History  Substance Use Topics  . Smoking status: Never Smoker  . Smokeless tobacco: Never Used  . Alcohol use No    Allergies as of 04/09/2016  . (No Known Allergies)    Review of Systems:    All systems reviewed and negative except where noted in HPI.   Physical Exam:  Vital signs in last 24 hours: Temp:  [98.9 F (37.2 C)-100 F (37.8 C)] 99.8 F (37.7 C) (08/01 1520) Pulse Rate:  [91-98] 91 (08/01 1520) Resp:  [16-20] 18 (08/01 1520) BP: (96-113)/(53-62) 99/53 (08/01 1520) SpO2:  [95 %-100 %] 95 % (08/01 1520) Last BM Date: 04/10/16 General:   Pleasant, cooperative in NAD Head:  Normocephalic and atraumatic. Eyes:   No icterus.   Conjunctiva pink. PERRLA. Ears:  Normal auditory acuity. Neck:  Supple; no masses or thyroidomegaly Lungs: Respirations even and unlabored. Lungs clear to auscultation bilaterally.   No  wheezes, crackles, or rhonchi.  Heart:  Regular rate and rhythm;  Without murmur, clicks, rubs or gallops Abdomen:  Soft, nondistended, Diffusely tender to light palpation Normal bowel sounds. No appreciable masses or hepatomegaly.  No rebound or guarding.  Rectal:  Not performed. Msk:  Symmetrical without gross deformities.    Extremities:  Without edema, cyanosis or clubbing. Neurologic:  Alert and oriented x3;  grossly normal neurologically. Skin:  Intact without significant lesions or rashes. Cervical Nodes:  No significant cervical adenopathy. Psych:  Alert and cooperative. Normal affect.  LAB RESULTS:  Recent Labs  04/09/16 1335 04/10/16 0304  WBC 43.9* 40.2*  HGB 14.6 13.2  HCT 42.3 38.8  PLT 459* 353     BMET  Recent Labs  04/09/16 1335 04/10/16 0304  NA 132* 133*  K 3.4* 2.7*  CL 92* 98*  CO2 25 23  GLUCOSE 168* 125*  BUN 55* 41*  CREATININE 1.50* 1.06*  CALCIUM 8.5* 7.6*   LFT  Recent Labs  04/09/16 1335  PROT 6.5  ALBUMIN 2.5*  AST 25  ALT 23  ALKPHOS 136*  BILITOT 0.7   PT/INR No results for input(s): LABPROT, INR in the last 72 hours.  STUDIES: Ct Abdomen Pelvis W Contrast  Result Date: 04/09/2016 CLINICAL DATA:  Lower abdominal pain and diarrhea for 2 weeks. Loss of appetite. EXAM: CT ABDOMEN AND PELVIS WITH CONTRAST TECHNIQUE: Multidetector CT imaging of the abdomen and pelvis was performed using the standard protocol following bolus administration of intravenous contrast. CONTRAST:  59mL ISOVUE-300 IOPAMIDOL (ISOVUE-300) INJECTION 61% COMPARISON:  None. FINDINGS: Lower chest: No pulmonary nodules, pleural effusions, or infiltrates. Coronary artery calcifications are present. Hepatobiliary: There is focal fat attenuation adjacent to the falciform ligaments. Low-attenuation lesions within the right hepatic lobe are most consistent with cysts, largest measuring 2.2 cm. The gallbladder is present. There is a small amount of pericholecystic fluid. Pancreas: Normal in appearance. Spleen: Normal in appearance. Renal/Adrenal: Adrenal glands are normal in appearance. Kidneys are normal in appearance. No focal renal mass or hydronephrosis. Gastrointestinal tract: Small hiatal type hernia. Small bowel loops are normal in appearance. The appendix is well seen and has a normal appearance. There is diffusely abnormal colon. The wall of the colon is thickened and there is mucosal enhancement. These changes affect the entire colon from the cecum to the rectum. There are scattered diverticula. Reproductive/Pelvis: Uterus is present. No adnexal mass. There is a small amount of free pelvic fluid. Vascular/Lymphatic: Atherosclerotic calcification in the abdominal aorta.  Musculoskeletal/Abdominal wall: Degenerative changes are identified in the lower thoracic and lumbar spine. Other: none IMPRESSION: 1. Pancolitis. Findings are consistent with infectious, inflammatory process. Less likely the findings could be related to ischemic causes. Although there are colonic diverticula, the process is in consistent with acute diverticulitis. 2. Small amount of pelvic ascites. 3. Pericholecystic fluid, likely secondary to the colitis rather than primary process involving the gallbladder. 4. Focal fatty infiltration of the liver. 5. Small liver cysts. 6. Hiatal hernia. 7. Normal appendix. Electronically Signed   By: Nolon Nations M.D.   On: 04/09/2016 17:19      Impression / Plan:   Brooke Goodman is a 75 y.o. y/o female with admission to the hospital with pancolitis and found to have C. Difficile colitis. The patient's symptoms are consistent with severe disease and she should be treated with both vancomycin and Flagyl.  Otherwise there is nothing further to do from a GI point of view.  The best approach to  a patient with C. Difficile is treating him with antibiotics.  If there are any further complications or patient does not respond please feel free to reconsult me. I will sign off.  Please call if any further GI concerns or questions.  We would like to thank you for the opportunity to participate in the care of [place].    Thank you for involving me in the care of this patient.      LOS: 1 day   Lucilla Lame, MD  04/10/2016, 6:55 PM   Note: This dictation was prepared with Dragon dictation along with smaller phrase technology. Any transcriptional errors that result from this process are unintentional.

## 2016-04-11 LAB — BASIC METABOLIC PANEL
Anion gap: 9 (ref 5–15)
BUN: 35 mg/dL — AB (ref 6–20)
CALCIUM: 7.8 mg/dL — AB (ref 8.9–10.3)
CO2: 25 mmol/L (ref 22–32)
Chloride: 99 mmol/L — ABNORMAL LOW (ref 101–111)
Creatinine, Ser: 0.94 mg/dL (ref 0.44–1.00)
GFR calc Af Amer: >60 mL/min (ref >60)
GFR, EST NON AFRICAN AMERICAN: 58 mL/min — AB (ref >60)
GLUCOSE: 136 mg/dL — AB (ref 65–99)
POTASSIUM: 3 mmol/L — AB (ref 3.5–5.1)
SODIUM: 133 mmol/L — AB (ref 135–145)

## 2016-04-11 LAB — CBC
HCT: 37 % (ref 35.0–47.0)
Hemoglobin: 12.5 g/dL (ref 12.0–16.0)
MCH: 29.5 pg (ref 26.0–34.0)
MCHC: 33.8 g/dL (ref 32.0–36.0)
MCV: 87.2 fL (ref 80.0–100.0)
PLATELETS: 395 10*3/uL (ref 150–440)
RBC: 4.24 MIL/uL (ref 3.80–5.20)
RDW: 12.7 % (ref 11.5–14.5)
WBC: 24.7 10*3/uL — ABNORMAL HIGH (ref 3.6–11.0)

## 2016-04-11 NOTE — Progress Notes (Signed)
[facility] at [place] NAME: Brooke Goodman    MR#:  [id number]  DATE OF BIRTH:  08-15-1941  SUBJECTIVE:   Pt. Here due to abdominal pain, diarrhea and noted to have C. Diff colitis. Still having some loose stools.  WBC count trending down.  Still having some abdominal cramping when having a BM.   REVIEW OF SYSTEMS:    Review of Systems  Constitutional: Negative for chills and fever.  HENT: Negative for congestion and tinnitus.   Eyes: Negative for blurred vision and double vision.  Respiratory: Negative for cough, shortness of breath and wheezing.   Cardiovascular: Negative for chest pain, orthopnea and PND.  Gastrointestinal: Positive for abdominal pain and diarrhea. Negative for nausea and vomiting.  Genitourinary: Negative for dysuria and hematuria.  Neurological: Negative for dizziness, sensory change and focal weakness.  All other systems reviewed and are negative.   Nutrition: Clear liquid Tolerating Diet: Yes Tolerating PT: Ambulatory     DRUG ALLERGIES:  No Known Allergies  VITALS:  Blood pressure (!) 110/50, pulse 90, temperature 98.9 F (37.2 C), temperature source Oral, resp. rate 18, height 5\' 3"  (1.6 m), weight 105.2 kg (232 lb), SpO2 98 %.  PHYSICAL EXAMINATION:   Physical Exam  GENERAL:  75 y.o.-year-old patient lying in the bed in no acute distress.  EYES: Pupils equal, round, reactive to light and accommodation. No scleral icterus. Extraocular muscles intact.  HEENT: Head atraumatic, normocephalic. Oropharynx and nasopharynx clear.  NECK:  Supple, no jugular venous distention. No thyroid enlargement, no tenderness.  LUNGS: Normal breath sounds bilaterally, no wheezing, rales, rhonchi. No use of accessory muscles of respiration.  CARDIOVASCULAR: S1, S2 normal. No murmurs, rubs, or gallops.  ABDOMEN: Soft, Tender diffusely, no rebound, rigidity, nondistended. Bowel sounds present. No organomegaly or mass.   EXTREMITIES: No cyanosis, clubbing or edema b/l.    NEUROLOGIC: Cranial nerves II through XII are intact. No focal Motor or sensory deficits b/l.   PSYCHIATRIC: The patient is alert and oriented x 3.  SKIN: No obvious rash, lesion, or ulcer.    LABORATORY PANEL:   CBC  Recent Labs Lab 04/11/16 0428  WBC 24.7*  HGB 12.5  HCT 37.0  PLT 395   ------------------------------------------------------------------------------------------------------------------  Chemistries   Recent Labs Lab 04/09/16 1335  04/11/16 0428  NA 132*  < > 133*  K 3.4*  < > 3.0*  CL 92*  < > 99*  CO2 25  < > 25  GLUCOSE 168*  < > 136*  BUN 55*  < > 35*  CREATININE 1.50*  < > 0.94  CALCIUM 8.5*  < > 7.8*  AST 25  --   --   ALT 23  --   --   ALKPHOS 136*  --   --   BILITOT 0.7  --   --   < > = values in this interval not displayed. ------------------------------------------------------------------------------------------------------------------  Cardiac Enzymes  Recent Labs Lab 04/10/16 0304  TROPONINI 0.20*   ------------------------------------------------------------------------------------------------------------------  RADIOLOGY:  Ct Abdomen Pelvis W Contrast  Result Date: 04/09/2016 CLINICAL DATA:  Lower abdominal pain and diarrhea for 2 weeks. Loss of appetite. EXAM: CT ABDOMEN AND PELVIS WITH CONTRAST TECHNIQUE: Multidetector CT imaging of the abdomen and pelvis was performed using the standard protocol following bolus administration of intravenous contrast. CONTRAST:  53mL ISOVUE-300 IOPAMIDOL (ISOVUE-300) INJECTION 61% COMPARISON:  None. FINDINGS: Lower chest: No pulmonary nodules, pleural effusions, or infiltrates. Coronary artery calcifications are present. Hepatobiliary: There  is focal fat attenuation adjacent to the falciform ligaments. Low-attenuation lesions within the right hepatic lobe are most consistent with cysts, largest measuring 2.2 cm. The gallbladder is present. There  is a small amount of pericholecystic fluid. Pancreas: Normal in appearance. Spleen: Normal in appearance. Renal/Adrenal: Adrenal glands are normal in appearance. Kidneys are normal in appearance. No focal renal mass or hydronephrosis. Gastrointestinal tract: Small hiatal type hernia. Small bowel loops are normal in appearance. The appendix is well seen and has a normal appearance. There is diffusely abnormal colon. The wall of the colon is thickened and there is mucosal enhancement. These changes affect the entire colon from the cecum to the rectum. There are scattered diverticula. Reproductive/Pelvis: Uterus is present. No adnexal mass. There is a small amount of free pelvic fluid. Vascular/Lymphatic: Atherosclerotic calcification in the abdominal aorta. Musculoskeletal/Abdominal wall: Degenerative changes are identified in the lower thoracic and lumbar spine. Other: none IMPRESSION: 1. Pancolitis. Findings are consistent with infectious, inflammatory process. Less likely the findings could be related to ischemic causes. Although there are colonic diverticula, the process is in consistent with acute diverticulitis. 2. Small amount of pelvic ascites. 3. Pericholecystic fluid, likely secondary to the colitis rather than primary process involving the gallbladder. 4. Focal fatty infiltration of the liver. 5. Small liver cysts. 6. Hiatal hernia. 7. Normal appendix. Electronically Signed   By: Nolon Nations M.D.   On: 04/09/2016 17:19     ASSESSMENT AND PLAN:   75 year old female hx of HTN, OA, Restless leg syndrome, Urinary Incontinence, Psoriasis, came into the  Due to abdominal pain, diarrhea and noted to have C. Diff colitis.   1. C. Diff colitis - cause of abdominal pain, Diarrhea.  - cont. Oral Vanco, Flagyl.  Improving.   - diarrhea improving.  Cont. Clear liquid diet.  - appreciate GI input  2. Leukocytosis - due to C. Diff colitis.  - cont. W/ treatment as mentioned above and it's improving.    3. Acute Kidney Injury - due to diarrhea, dehydration.  - improved w/ IV fluids.     4. Hypokalemia - due to diarrhea.   - improved with supplementation and will cont. To monitor. Check Mg. Level.    5. Elevated Troponin - demand ischemia secondary from dehydration and acute kidney injury. -Troponins have now trended down. Asymptomatic.    All the records are reviewed and case discussed with Care Management/Social Workerr. Management plans discussed with the patient, family and they are in agreement.  CODE STATUS: Full  DVT Prophylaxis: Lovenox  TOTAL TIME TAKING CARE OF THIS PATIENT: 30 minutes.   POSSIBLE D/C IN 2-3 DAYS, DEPENDING ON CLINICAL CONDITION.   Henreitta Leber M.D on 04/11/2016 at 2:35 PM  Between 7am to 6pm - Pager - [phone]  After 6pm go to [email or web address] - password [email or web address] [facility]  Office  [phone]  CC: Primary care physician; Viviana Simpler, MD

## 2016-04-11 NOTE — Care Management Note (Signed)
Case Management Note  Patient Details  Name: Brooke Goodman MRN: [id number] Date of Birth: [birth year]  Subjective/Objective:       Patient is alert and oriented from home alone and independent.  Uses no DME to ambulate and drives self , Adult daughter lives nearby and is able to assist her.  Gets meds filled at [facility]. No CM needs identified.          Action/Plan:Anticipated discharge is home with self care.   Expected Discharge Date:                  Expected Discharge Plan:  Home/Self Care  In-House Referral:     Discharge planning Services  CM Consult  Post Acute Care Choice:    Choice offered to:  NA  DME Arranged:    DME Agency:     HH Arranged:  NA HH Agency:     Status of Service:     If discussed at [place] of Stay Meetings, dates discussed:    Additional Comments:  Alvie Heidelberg, RN 04/11/2016, 2:32 PM

## 2016-04-12 LAB — BASIC METABOLIC PANEL
ANION GAP: 10 (ref 5–15)
BUN: 30 mg/dL — ABNORMAL HIGH (ref 6–20)
CHLORIDE: 98 mmol/L — AB (ref 101–111)
CO2: 25 mmol/L (ref 22–32)
CREATININE: 0.82 mg/dL (ref 0.44–1.00)
Calcium: 7.7 mg/dL — ABNORMAL LOW (ref 8.9–10.3)
GFR calc non Af Amer: >60 mL/min (ref >60)
Glucose, Bld: 120 mg/dL — ABNORMAL HIGH (ref 65–99)
POTASSIUM: 3.2 mmol/L — AB (ref 3.5–5.1)
SODIUM: 133 mmol/L — AB (ref 135–145)

## 2016-04-12 LAB — CBC
HEMATOCRIT: 35.9 % (ref 35.0–47.0)
HEMOGLOBIN: 12.5 g/dL (ref 12.0–16.0)
MCH: 30.4 pg (ref 26.0–34.0)
MCHC: 34.8 g/dL (ref 32.0–36.0)
MCV: 87.3 fL (ref 80.0–100.0)
Platelets: 434 10*3/uL (ref 150–440)
RBC: 4.12 MIL/uL (ref 3.80–5.20)
RDW: 12.7 % (ref 11.5–14.5)
WBC: 17.7 10*3/uL — AB (ref 3.6–11.0)

## 2016-04-12 LAB — MAGNESIUM: MAGNESIUM: 2.2 mg/dL (ref 1.7–2.4)

## 2016-04-12 NOTE — Progress Notes (Signed)
[facility] [place] NAME: Brooke Goodman    MR#:  [id number]  DATE OF BIRTH:  Dec 07, 1940  SUBJECTIVE:   Pt. Here due to abdominal pain, diarrhea and noted to have C. Diff colitis. Diarrhea improved.  Abdominal pain improved.  Son at bedside.  Wants to try and eat a soft diet.     REVIEW OF SYSTEMS:    Review of Systems  Constitutional: Negative for chills and fever.  HENT: Negative for congestion and tinnitus.   Eyes: Negative for blurred vision and double vision.  Respiratory: Negative for cough, shortness of breath and wheezing.   Cardiovascular: Negative for chest pain, orthopnea and PND.  Gastrointestinal: Positive for abdominal pain and diarrhea. Negative for nausea and vomiting.  Genitourinary: Negative for dysuria and hematuria.  Neurological: Negative for dizziness, sensory change and focal weakness.  All other systems reviewed and are negative.   Nutrition: Soft Diet.  Tolerating Diet: Yes Tolerating PT: Ambulatory     DRUG ALLERGIES:  No Known Allergies  VITALS:  Blood pressure 104/63, pulse 85, temperature 98.7 F (37.1 C), temperature source Oral, resp. rate 18, height 5\' 3"  (1.6 m), weight 105.2 kg (232 lb), SpO2 96 %.  PHYSICAL EXAMINATION:   Physical Exam  GENERAL:  75 y.o.-year-old patient lying in the bed in no acute distress.  EYES: Pupils equal, round, reactive to light and accommodation. No scleral icterus. Extraocular muscles intact.  HEENT: Head atraumatic, normocephalic. Oropharynx and nasopharynx clear.  NECK:  Supple, no jugular venous distention. No thyroid enlargement, no tenderness.  LUNGS: Normal breath sounds bilaterally, no wheezing, rales, rhonchi. No use of accessory muscles of respiration.  CARDIOVASCULAR: S1, S2 normal. No murmurs, rubs, or gallops.  ABDOMEN: Soft, Tender in RLQ but no rebound, rigidity, nondistended. Bowel sounds present. No organomegaly or mass.  EXTREMITIES: No cyanosis,  clubbing or edema b/l.    NEUROLOGIC: Cranial nerves II through XII are intact. No focal Motor or sensory deficits b/l.   PSYCHIATRIC: The patient is alert and oriented x 3.  SKIN: No obvious rash, lesion, or ulcer.    LABORATORY PANEL:   CBC  Recent Labs Lab 04/12/16 0510  WBC 17.7*  HGB 12.5  HCT 35.9  PLT 434   ------------------------------------------------------------------------------------------------------------------  Chemistries   Recent Labs Lab 04/09/16 1335  04/12/16 0510  NA 132*  < > 133*  K 3.4*  < > 3.2*  CL 92*  < > 98*  CO2 25  < > 25  GLUCOSE 168*  < > 120*  BUN 55*  < > 30*  CREATININE 1.50*  < > 0.82  CALCIUM 8.5*  < > 7.7*  MG  --   --  2.2  AST 25  --   --   ALT 23  --   --   ALKPHOS 136*  --   --   BILITOT 0.7  --   --   < > = values in this interval not displayed. ------------------------------------------------------------------------------------------------------------------  Cardiac Enzymes  Recent Labs Lab 04/10/16 0304  TROPONINI 0.20*   ------------------------------------------------------------------------------------------------------------------  RADIOLOGY:  No results found.   ASSESSMENT AND PLAN:   75 year old female hx of HTN, OA, Restless leg syndrome, Urinary Incontinence, Psoriasis, came into the  Due to abdominal pain, diarrhea and noted to have C. Diff colitis.   1. C. Diff colitis - cause of abdominal pain, Diarrhea.  - cont. Oral Vanco, Flagyl.  Much improved since admission  - diarrhea improving.  Will  advance to soft diet.  - appreciate GI input  2. Leukocytosis - due to C. Diff colitis.  - cont. W/ treatment as mentioned above and it's improving.   3. Acute Kidney Injury - due to diarrhea, dehydration.  - resolved w/ IV fluids.     4. Hypokalemia - due to diarrhea.   - improving with supplementation and will cont. To monitor. Mg level normal.    5. Elevated Troponin - demand ischemia secondary  from dehydration and acute kidney injury. -Troponins have now trended down. Asymptomatic.   Likely d/c home tomorrow if doing well.   All the records are reviewed and case discussed with Care Management/Social Workerr. Management plans discussed with the patient, family and they are in agreement.  CODE STATUS: Full  DVT Prophylaxis: Lovenox  TOTAL TIME TAKING CARE OF THIS PATIENT: 25 minutes.   POSSIBLE D/C IN 1-2 DAYS, DEPENDING ON CLINICAL CONDITION.   Henreitta Leber M.D on 04/12/2016 at 1:53 PM  Between 7am to 6pm - Pager - [phone]  After 6pm go to [email or web address] - password [email or web address] [facility]  Office  [phone]  CC: Primary care physician; Viviana Simpler, MD

## 2016-04-13 LAB — CBC
HEMATOCRIT: 41.6 % (ref 35.0–47.0)
Hemoglobin: 14 g/dL (ref 12.0–16.0)
MCH: 29.6 pg (ref 26.0–34.0)
MCHC: 33.6 g/dL (ref 32.0–36.0)
MCV: 88.1 fL (ref 80.0–100.0)
Platelets: 530 10*3/uL — ABNORMAL HIGH (ref 150–440)
RBC: 4.73 MIL/uL (ref 3.80–5.20)
RDW: 12.7 % (ref 11.5–14.5)
WBC: 13.5 10*3/uL — AB (ref 3.6–11.0)

## 2016-04-13 LAB — BASIC METABOLIC PANEL
ANION GAP: 10 (ref 5–15)
BUN: 28 mg/dL — AB (ref 6–20)
CO2: 26 mmol/L (ref 22–32)
Calcium: 8 mg/dL — ABNORMAL LOW (ref 8.9–10.3)
Chloride: 99 mmol/L — ABNORMAL LOW (ref 101–111)
Creatinine, Ser: 0.92 mg/dL (ref 0.44–1.00)
GFR calc Af Amer: >60 mL/min (ref >60)
GFR calc non Af Amer: 59 mL/min — ABNORMAL LOW (ref >60)
GLUCOSE: 126 mg/dL — AB (ref 65–99)
POTASSIUM: 3.7 mmol/L (ref 3.5–5.1)
Sodium: 135 mmol/L (ref 135–145)

## 2016-04-13 MED ORDER — VANCOMYCIN 50 MG/ML ORAL SOLUTION: 125.0000 mg | Freq: Four times a day (QID) | ORAL | 0 refills | Status: DC

## 2016-04-13 MED ORDER — VANCOMYCIN HCL 250 MG PO CAPS: 250.0000 mg | ORAL_CAPSULE | Freq: Four times a day (QID) | ORAL | 0 refills | Status: AC

## 2016-04-13 MED ORDER — LACTINEX PO CHEW: 1.0000 | CHEWABLE_TABLET | Freq: Three times a day (TID) | ORAL | 0 refills | Status: DC

## 2016-04-13 NOTE — Evaluation (Signed)
Physical Therapy Evaluation Patient Details Name: TAQUANNA KINDERKNECHT MRN: [id number] DOB: [birth year] Today's Date: 04/13/2016   History of Present Illness  Pt is a 75 yr old female presenting wtih abdominal pain and diarrhea x2 weeks. Admitted with pancolitis and found to have C.Diff. PMH significant for HTN, OA, hiatal hernia, and h/o R TKA 04/2015    Clinical Impression  Prior to admission, pt was independent with all functional mobility and ADLs.  Pt lives alone in a one-story home with 2STE and has very supportive family nearby.  Currently, pt is mod I for bed mobility and transfers, and supervision for ambulation x70ft with RW.  Pt is limited by generalized weakness and deconditioning associated with diarrhea x2 weeks, demonstrated by instability in standing/mobilizing without AD and fatigue.  Pt would benefit from skilled PT to address noted impairments and functional limitations.  Recommend pt discharge home with use of RW, OOB supervision for safety, and no PT follow up when medically appropriate.     Follow Up Recommendations No PT follow up; supervision for OOB RN and CM notified    Equipment Recommendations   (pt has RW)    Recommendations for Other Services       Precautions / Restrictions Precautions Precautions: Fall Restrictions Weight Bearing Restrictions: No      Mobility  Bed Mobility Overal bed mobility: Modified Independent General bed mobility comments: Heavy use of bed rails. Increased time required.  Transfers Overall transfer level: Modified independent Equipment used: Rolling walker (2 wheeled) Transfers: Sit to/from Stand (x 3 trials) Sit to Stand: Modified independent (Device/Increase time) General transfer comment: Pt able to complete sit <> stand transfers from EOB and BSC mod I with UE support. Initial stand pt without AD, but demonstrating instability in standing requiring UE support on bed rails. Repeated trials with RW demonstrating  appropriate/safe DME use and increased stability.  Ambulation/Gait Ambulation/Gait assistance: Supervision Ambulation Distance (Feet): 60 Feet Assistive device: Rolling walker (2 wheeled) Gait Pattern/deviations: WFL(Within Functional Limits) Gait velocity: Decreased General Gait Details: Pt able to ambualte 5ft in room with RW and supervision. Slow and steady but without LOB. Distance limited by fatigue.   Stairs Stairs: Yes  Stairs assistance: supervision Stair management: One rail right Number of stairs: 2 Comments: Pt able to navigate 2 stairs as required for home entry with increased time and effort.   Wheelchair Mobility    Modified Rankin (Stroke Patients Only)       Balance Overall balance assessment: Modified Independent      Pertinent Vitals/Pain Pain Assessment: No/denies pain  HR and O2 monitored throughout session and maintained WFL.    Home Living Family/patient expects to be discharged to:: Private residence Living Arrangements: Alone Available Help at Discharge: Family;Friend(s);Available PRN/intermittently Type of Home: House Home Access: Stairs to enter Entrance Stairs-Rails: Right Entrance Stairs-Number of Steps: 2 Home Layout: One level Home Equipment: Walker - 2 wheels;Shower seat;Grab bars - tub/shower Bathroom Shower/Tub: Network engineer: handicapped height Bathroom accessibility: yes   Prior Function Level of Independence: Independent       Hand Dominance   Dominant Hand: Right    Extremity/Trunk Assessment   Upper Extremity Assessment: Generalized weakness  Lower Extremity Assessment: Generalized weakness    Communication   Communication: No difficulties  Cognition Arousal/Alertness: Awake/alert Behavior During Therapy: WFL for tasks assessed/performed Overall Cognitive Status: Within Functional Limits for tasks assessed    General Comments General comments (skin integrity, edema, etc.): Pt with request to  use the bathroom  during my evaluation, loose/liquidy stools noted in both brief and BSC.           Assessment/Plan    PT Assessment Patient needs continued PT services  PT Diagnosis Generalized weakness   PT Problem List Decreased strength;Decreased balance  PT Treatment Interventions Gait training;Stair training;Functional mobility training;Therapeutic activities;Therapeutic exercise;Balance training   PT Goals (Current goals can be found in the Care Plan section) Acute Rehab PT Goals Patient Stated Goal: To go home PT Goal Formulation: With patient Time For Goal Achievement: 04/27/16 Potential to Achieve Goals: Good    Frequency Min 2X/week   Barriers to discharge           End of Session Equipment Utilized During Treatment: Gait belt Activity Tolerance: Patient limited by fatigue Patient left: in bed;with call bell/phone within reach;with bed alarm set;with SCD's reapplied Nurse Communication: Mobility status;Precautions         Time: [id number] PT Time Calculation (min) (ACUTE ONLY): 43 min   Charges:         PT G Codes:        Kacey Dysert, SPT 04/13/2016, 12:41 PM

## 2016-04-13 NOTE — Progress Notes (Signed)
PT at bedside to evaluate and work with patient.

## 2016-04-13 NOTE — Discharge Summary (Signed)
[facility] [place] NAME: Brooke Goodman    MR#:  [id number]  DATE OF BIRTH:  June 24, 1941  DATE OF ADMISSION:  04/09/2016 ADMITTING PHYSICIAN: Dustin Flock, MD  DATE OF DISCHARGE: 04/13/2016  PRIMARY CARE PHYSICIAN: Viviana Simpler, MD    ADMISSION DIAGNOSIS:  ABD pain, lo BP; from [facility] DIAGNOSIS:  Active Problems:   Colitis   C. difficile colitis   SECONDARY DIAGNOSIS:   Past Medical History:  Diagnosis Date  . Diverticulitis   . Hypertension   . Osteoarthritis   . Psoriasis   . Restless leg syndrome   . Urinary incontinence     HOSPITAL COURSE:   75 year old female with history of essential hypertension who presented with diarrhea and found to have C. difficile diarrhea.  1. C. difficile colitis: Patient presented with abdominal pain and diarrhea. C. difficile cultures were positive. She is treated on vancomycin and Flagyl. Her symptoms have improved since admission. She will be discharged on oral vancomycin for total of 2 weeks. She will continue soft diet for another few days. GI was consulted and input was appreciated.  2. Leukocytosis: This is due to C. difficile colitis. White blood cell count has improved.  3. Acute kidney injury: This is due to dehydration with diarrhea. IV fluids were started and creatinine has improved.  4. Hypokalemia: This is due to diarrhea and has improved with treatment.  5. Elevated troponin: This is due to demand ischemia and not ACS. 6. Essential hypertension: Patient may resume Maxide.  DISCHARGE CONDITIONS AND DIET:   Stable for discharge on soft diet  CONSULTS OBTAINED:    DRUG ALLERGIES:  No Known Allergies  DISCHARGE MEDICATIONS:   Current Discharge Medication List    START taking these medications   Details  lactobacillus acidophilus & bulgar (LACTINEX) chewable tablet Chew 1 tablet by mouth 3 (three) times daily with meals. Qty: 30 tablet, Refills: 0     vancomycin (VANCOCIN) 50 mg/mL oral solution Take 2.5 mLs (125 mg total) by mouth 4 (four) times daily. Qty: 10 mL, Refills: 0      CONTINUE these medications which have NOT CHANGED   Details  clobetasol cream (TEMOVATE) AB-123456789 % Apply 1 application topically 2 (two) times daily. Qty: 60 g, Refills: 1    Multiple Vitamin (MULTIVITAMIN) tablet Take 1 tablet by mouth daily.    triamterene-hydrochlorothiazide (MAXZIDE-25) 37.5-25 MG per tablet Take 1 tablet by mouth  daily Qty: 90 tablet, Refills: 3              Today   CHIEF COMPLAINT:  Ring very well this morning. Diarrhea has improved.  VITAL SIGNS:  Blood pressure 120/90, pulse 80, temperature 97.1 F (36.2 C), temperature source Oral, resp. rate 16, height 5\' 3"  (1.6 m), weight 105.2 kg (232 lb), SpO2 99 %.   REVIEW OF SYSTEMS:  Review of Systems  Constitutional: Negative.  Negative for chills, fever and malaise/fatigue.  HENT: Negative.  Negative for ear discharge, ear pain, hearing loss, nosebleeds and sore throat.   Eyes: Negative.  Negative for blurred vision and pain.  Respiratory: Negative.  Negative for cough, hemoptysis, shortness of breath and wheezing.   Cardiovascular: Negative.  Negative for chest pain, palpitations and leg swelling.  Gastrointestinal: Negative.  Negative for abdominal pain, blood in stool, diarrhea, nausea and vomiting.  Genitourinary: Negative.  Negative for dysuria.  Musculoskeletal: Negative.  Negative for back pain.  Skin: Negative.   Neurological: Negative for dizziness,  tremors, speech change, focal weakness, seizures and headaches.  Endo/Heme/Allergies: Negative.  Does not bruise/bleed easily.  Psychiatric/Behavioral: Negative.  Negative for depression, hallucinations and suicidal ideas.     PHYSICAL EXAMINATION:  GENERAL:  75 y.o.-year-old patient lying in the bed with no acute distress.  NECK:  Supple, no jugular venous distention. No thyroid enlargement, no tenderness.   LUNGS: Normal breath sounds bilaterally, no wheezing, rales,rhonchi  No use of accessory muscles of respiration.  CARDIOVASCULAR: S1, S2 normal. No murmurs, rubs, or gallops.  ABDOMEN: Soft, non-tender, non-distended. Bowel sounds present. No organomegaly or mass.  EXTREMITIES: No pedal edema, cyanosis, or clubbing.  PSYCHIATRIC: The patient is alert and oriented x 3.  SKIN: No obvious rash, lesion, or ulcer.   DATA REVIEW:   CBC  Recent Labs Lab 04/13/16 0415  WBC 13.5*  HGB 14.0  HCT 41.6  PLT 530*    Chemistries   Recent Labs Lab 04/09/16 1335  04/12/16 0510 04/13/16 0415  NA 132*  < > 133* 135  K 3.4*  < > 3.2* 3.7  CL 92*  < > 98* 99*  CO2 25  < > 25 26  GLUCOSE 168*  < > 120* 126*  BUN 55*  < > 30* 28*  CREATININE 1.50*  < > 0.82 0.92  CALCIUM 8.5*  < > 7.7* 8.0*  MG  --   --  2.2  --   AST 25  --   --   --   ALT 23  --   --   --   ALKPHOS 136*  --   --   --   BILITOT 0.7  --   --   --   < > = values in this interval not displayed.  Cardiac Enzymes  Recent Labs Lab 04/09/16 1334 04/09/16 2255 04/10/16 0304  TROPONINI 0.22* 0.56* 0.20*    Microbiology Results  @[email or web address] @  RADIOLOGY:  No results found.    Management plans discussed with the patient and she is in agreement. Stable for discharge home  Patient should follow up with pcp in 1 week  CODE STATUS:     Code Status Orders        Start     Ordered   04/09/16 1815  Full code  Continuous     04/09/16 1815    Code Status History    Date Active Date Inactive Code Status Order ID Comments User Context   04/27/2015 12:27 PM 04/30/2015  4:38 PM Full Code [id number]  Dereck Leep, MD Inpatient      TOTAL TIME TAKING CARE OF THIS PATIENT: 35 minutes.    Note: This dictation was prepared with Dragon dictation along with smaller phrase technology. Any transcriptional errors that result from this process are unintentional.  Klea Nall M.D on 04/13/2016 at 12:11 PM  Between 7am  to 6pm - Pager - [phone] After 6pm go to [email or web address] - password [email or web address] [facility]  Office  [phone]  CC: Primary care physician; Viviana Simpler, MD

## 2016-04-13 NOTE — Care Management Important Message (Signed)
Important Message  Patient Details  Name: Brooke Goodman MRN: [id number] Date of Birth: August 14, 1941   Medicare Important Message Given:  Yes    Alvie Heidelberg, RN 04/13/2016, 10:42 AM

## 2016-04-13 NOTE — Consult Note (Signed)
   [facility] CM Inpatient Consult   04/13/2016  Brooke Goodman August 04, 1941 [id number]   Patient screened for potential [facility] Management services. Patient is eligible for [place]. Electronic medical record reveals there were no identifiable [facility] care management needs at this time. [facility] Care Management services not appropriate at this time. If patient's post hospital needs change please place a [facility] Care Management consult. For questions please contact:   Daijha Leggio RN, [facility] Liaison  [phone]) [employer] [phone]) Toll free office

## 2016-04-13 NOTE — Progress Notes (Signed)
Pt discharged at this time.  Daughter here to drive patient home.  Pt smiling and thankful for care.  Discharge paperwork gone over with patient.  RX given to patient.  Verbalized understanding of discharge instructions.  Including bland diet, and yogurt+ probiotic for gut health.  Pt to visitors entrance via wheelchair by CNA.

## 2016-04-14 LAB — CULTURE, BLOOD (ROUTINE X 2)
CULTURE: NO GROWTH
Culture: NO GROWTH

## 2016-04-16 ENCOUNTER — Telehealth: Payer: Self-pay

## 2016-04-16 NOTE — Telephone Encounter (Signed)
Transition Care Management Follow-up Telephone Call     Date discharged? 04/13/2016  How have you been since you were released from the hospital? Pt is recovering but is concerned about medications.    Do you understand why you were in the hospital? Yes   Do you understand the discharge instructions? Yes   Where were you discharged to? Home   Items Reviewed:  Medications reviewed: Yes  Allergies reviewed: Yes   Dietary changes reviewed: Yes  Referrals reviewed: N/A   Functional Questionnaire: Daughter may help with ADLs as needed   Activities of Daily Living (ADLs):  Independent with all ADLs  Personal hygiene  Dressing Eating  Maintaining continence Transferring  Independent Activities of Daily Living (iADLs): Independent with all iADLs  Basic communication skills Transportation Meal preparation  Shopping Housework  Managing medications Managing personal finances   Confirmed importance and date/time of follow-up visits scheduled YES  Provider Appointment booked 04/19/16 @ 1445  Confirmed with patient if condition begins to worsen call PCP or go to the ER.  Patient was given the office number and encouraged to call back with question or concerns: YES

## 2016-04-17 ENCOUNTER — Telehealth: Payer: Self-pay

## 2016-04-17 NOTE — Telephone Encounter (Signed)
Pt was just discharged from hospital with c diff.and pt did not get lactobacillus acidophilust bulgar; per med list med was sent electronically  [facility]. Pt will ck with [facility].  Pt said she has no strength and pt wants to know what can take to increase her strength. Pt taking multi vitamin. Pt request cb. [employer].pt has f/u appt with Dr Silvio Pate on 04/19/16.

## 2016-04-17 NOTE — Telephone Encounter (Signed)
Spoke to pt. She has an upcoming OV

## 2016-04-17 NOTE — Telephone Encounter (Signed)
This is just a probiotic. She can probably get an OTC for now--not sure higher dose is needed (but it depends on the formulation)

## 2016-04-19 ENCOUNTER — Encounter: Payer: Self-pay | Admitting: Internal Medicine

## 2016-04-19 ENCOUNTER — Ambulatory Visit (INDEPENDENT_AMBULATORY_CARE_PROVIDER_SITE_OTHER): Payer: Medicare Other | Admitting: Internal Medicine

## 2016-04-19 DIAGNOSIS — A047 Enterocolitis due to Clostridium difficile: Secondary | ICD-10-CM

## 2016-04-19 DIAGNOSIS — A0472 Enterocolitis due to Clostridium difficile, not specified as recurrent: Secondary | ICD-10-CM

## 2016-04-19 LAB — CBC WITH DIFFERENTIAL/PLATELET
BASOS ABS: 0 10*3/uL (ref 0.0–0.1)
BASOS PCT: 0.2 % (ref 0.0–3.0)
Eosinophils Absolute: 0.1 10*3/uL (ref 0.0–0.7)
Eosinophils Relative: 0.5 % (ref 0.0–5.0)
HEMATOCRIT: 36.3 % (ref 36.0–46.0)
Hemoglobin: 12.1 g/dL (ref 12.0–15.0)
LYMPHS PCT: 16.3 % (ref 12.0–46.0)
Lymphs Abs: 1.8 10*3/uL (ref 0.7–4.0)
MCHC: 33.3 g/dL (ref 30.0–36.0)
MCV: 88.3 fl (ref 78.0–100.0)
MONOS PCT: 6.6 % (ref 3.0–12.0)
Monocytes Absolute: 0.7 10*3/uL (ref 0.1–1.0)
NEUTROS ABS: 8.2 10*3/uL — AB (ref 1.4–7.7)
Neutrophils Relative %: 76.4 % (ref 43.0–77.0)
PLATELETS: 523 10*3/uL — AB (ref 150.0–400.0)
RBC: 4.11 Mil/uL (ref 3.87–5.11)
RDW: 12.8 % (ref 11.5–15.5)
WBC: 10.8 10*3/uL — ABNORMAL HIGH (ref 4.0–10.5)

## 2016-04-19 NOTE — Progress Notes (Signed)
Pre visit review using our clinic review tool, if applicable. No additional management support is needed unless otherwise documented below in the visit note. 

## 2016-04-19 NOTE — Progress Notes (Signed)
Subjective:    Patient ID: Brooke Goodman, female    DOB: [birth year], 75 y.o.   MRN: [id number]  HPI Here for hospital follow up  Had been on an antibiotic from the dentist (clindamycin) Developed diarrhea Eventually quite sick and fever Reviewed hospital records  Eating some Soft food Usually has fecal urgency and has to go after eating Wearing depends due to some incontinence Still on vancomycin--has another 5 days or so No fever No abdominal pain--just the urgency Still on probiotic  Current Outpatient Prescriptions on File Prior to Visit  Medication Sig Dispense Refill  . clobetasol cream (TEMOVATE) [id number] % Apply 1 application topically 2 (two) times daily. (Patient taking differently: Apply 1 application topically 2 (two) times daily as needed. ) 60 g 1  . Multiple Vitamin (MULTIVITAMIN) tablet Take 1 tablet by mouth daily.    . Probiotic Product (PROBIOTIC DAILY PO) Take 1 tablet by mouth 2 (two) times daily.    Marland Kitchen triamterene-hydrochlorothiazide (MAXZIDE-25) 37.5-25 MG per tablet Take 1 tablet by mouth  daily 90 tablet 3  . vancomycin (VANCOCIN) 250 MG capsule Take 1 capsule (250 mg total) by mouth 4 (four) times daily. 48 capsule 0  . lactobacillus acidophilus & bulgar (LACTINEX) chewable tablet Chew 1 tablet by mouth 3 (three) times daily with meals. (Patient not taking: Reported on 04/16/2016) 30 tablet 0   No current facility-administered medications on file prior to visit.     No Known Allergies  Past Medical History:  Diagnosis Date  . Diverticulitis   . Hypertension   . Osteoarthritis   . Psoriasis   . Restless leg syndrome   . Urinary incontinence     Past Surgical History:  Procedure Laterality Date  . TENDON RELEASE  07/98   right thumb  . 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 Coronary artery disease Sister   . Cancer Sister     breast  . Hypertension Neg Hx   . Diabetes Neg Hx     Social History   Social History  . Marital status: Widowed    Spouse name: N/A  . Number of children: 3  . Years of education: N/A   Occupational History  . [employer]     retired 2016   Social History Main Topics  . Smoking status: Never Smoker  . Smokeless tobacco: Never Used  . Alcohol use No  . Drug use: No  . Sexual activity: Not on file   Other Topics Concern  . Not on file   Social History Narrative   Widowed 2011   No living will   Would want daughter [name] to make health care decisions for her   Would want attempts at resuscitation   Probably would accept tube feeds   Review of Systems  No cough or SOB Sleeping okay Has had edema--some better since the hospital though     Objective:   Physical Exam  Constitutional: She appears well-developed and well-nourished. No distress.  Neck: Normal range of motion. Neck supple.  Pulmonary/Chest: Effort normal and breath sounds normal. No respiratory distress. She has no wheezes. She has no rales.  Abdominal: Soft. Bowel sounds are normal. She exhibits no distension. There is no tenderness. There is no  rebound and no guarding.  Lymphadenopathy:    She has no cervical adenopathy.          Assessment & Plan:

## 2016-04-19 NOTE — Assessment & Plan Note (Addendum)
Improved Stools still not normal but better. Still urgency and loose Will finish out vanco and continue the probiotic (discussed considering staying on indefinitely) Okay to liberalize diet (but be careful with dairy and heavy foods)

## 2016-05-03 DIAGNOSIS — Z96651 Presence of right artificial knee joint: Secondary | ICD-10-CM | POA: Diagnosis not present

## 2016-06-08 ENCOUNTER — Ambulatory Visit (INDEPENDENT_AMBULATORY_CARE_PROVIDER_SITE_OTHER): Payer: Medicare Other

## 2016-06-08 DIAGNOSIS — Z23 Encounter for immunization: Secondary | ICD-10-CM | POA: Diagnosis not present

## 2016-07-02 ENCOUNTER — Other Ambulatory Visit: Payer: Self-pay | Admitting: Internal Medicine

## 2016-08-06 ENCOUNTER — Encounter: Payer: Self-pay | Admitting: Internal Medicine

## 2016-08-06 ENCOUNTER — Ambulatory Visit (INDEPENDENT_AMBULATORY_CARE_PROVIDER_SITE_OTHER): Payer: Medicare Other | Admitting: Internal Medicine

## 2016-08-06 VITALS — BP 130/72 | HR 71 | Temp 98.2°F | Ht 61.5 in | Wt 222.0 lb

## 2016-08-06 DIAGNOSIS — M778 Other enthesopathies, not elsewhere classified: Secondary | ICD-10-CM

## 2016-08-06 DIAGNOSIS — Z7189 Other specified counseling: Secondary | ICD-10-CM

## 2016-08-06 DIAGNOSIS — I10 Essential (primary) hypertension: Secondary | ICD-10-CM

## 2016-08-06 DIAGNOSIS — Z0000 Encounter for general adult medical examination without abnormal findings: Secondary | ICD-10-CM

## 2016-08-06 DIAGNOSIS — E785 Hyperlipidemia, unspecified: Secondary | ICD-10-CM

## 2016-08-06 DIAGNOSIS — Z6841 Body Mass Index (BMI) 40.0 and over, adult: Secondary | ICD-10-CM

## 2016-08-06 DIAGNOSIS — L659 Nonscarring hair loss, unspecified: Secondary | ICD-10-CM | POA: Diagnosis not present

## 2016-08-06 DIAGNOSIS — Z1211 Encounter for screening for malignant neoplasm of colon: Secondary | ICD-10-CM

## 2016-08-06 DIAGNOSIS — M779 Enthesopathy, unspecified: Secondary | ICD-10-CM

## 2016-08-06 NOTE — Assessment & Plan Note (Signed)
Discussed exercise DASH info given

## 2016-08-06 NOTE — Assessment & Plan Note (Signed)
Has blank forms Urged her to fill them out

## 2016-08-06 NOTE — Assessment & Plan Note (Signed)
No obvious abnormality Hopefully just telogen effluvium

## 2016-08-06 NOTE — Progress Notes (Signed)
Pre visit review using our clinic review tool, if applicable. No additional management support is needed unless otherwise documented below in the visit note. 

## 2016-08-06 NOTE — Assessment & Plan Note (Signed)
[name] is not excited about medication for this

## 2016-08-06 NOTE — Assessment & Plan Note (Signed)
I have personally reviewed the Medicare Annual Wellness questionnaire and have noted 1. The patient's medical and social history 2. Their use of alcohol, tobacco or illicit drugs 3. Their current medications and supplements 4. The patient's functional ability including ADL's, fall risks, home safety risks and hearing or visual             impairment. 5. Diet and physical activities 6. Evidence for depression or mood disorders  The patients weight, height, BMI and visual acuity have been recorded in the chart I have made referrals, counseling and provided education to the patient based review of the above and I have provided the pt with a written personalized care plan for preventive services.  I have provided you with a copy of your personalized plan for preventive services. Please take the time to review along with your updated medication list.  Immunizations are UTD Will do FIT Discussed exercise

## 2016-08-06 NOTE — Progress Notes (Signed)
Subjective:    Patient ID: Brooke Goodman, female    DOB: Apr 27, 1941, 75 y.o.   MRN: [id number]  HPI Here for Medicare wellness visit and follow up of chronic medical conditions Reviewed form and advanced directives Reviewed other doctors No alcohol or tobacco No depression or anhedonia No falls Vision is okay--needs new glasses Hearing is fine Independent with instrumental ADLs No apparent memory problems  For about a month her hair is different Thinner and falling out ?related to nutritional issues with the C dif No more diarrhea--still taking yogurt and probiotics  Right shoulder pain since last week Relates to picking up 20# [place] and moving furniture, etc "It is just overworked" Hard time lifting arm though  No chest pain or SOB No palpitations No dizziness or syncope No edema  Discussed activity levels Will give health eating info  No problems with psoriasis of late Hasn't needed the cream lately  Current Outpatient Prescriptions on File Prior to Visit  Medication Sig Dispense Refill  . clobetasol cream (TEMOVATE) [id number] % Apply 1 application topically 2 (two) times daily. (Patient taking differently: Apply 1 application topically 2 (two) times daily as needed. ) 60 g 1  . Multiple Vitamin (MULTIVITAMIN) tablet Take 1 tablet by mouth daily.    . Probiotic Product (PROBIOTIC DAILY PO) Take 1 tablet by mouth 2 (two) times daily.    Marland Kitchen triamterene-hydrochlorothiazide (MAXZIDE-25) 37.5-25 MG tablet TAKE 1 TABLET BY MOUTH  DAILY 90 tablet 0   No current facility-administered medications on file prior to visit.     No Known Allergies  Past Medical History:  Diagnosis Date  . Diverticulitis   . Hypertension   . Osteoarthritis   . Psoriasis   . Restless leg syndrome   . Urinary incontinence     Past Surgical History:  Procedure Laterality Date  . TENDON RELEASE  07/98   right thumb  . 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 Coronary artery disease Sister   . Cancer Sister     breast  . Hypertension Neg Hx   . Diabetes Neg Hx     Social History   Social History  . Marital status: Widowed    Spouse name: N/A  . Number of children: 3  . Years of education: N/A   Occupational History  . [employer]     retired 2016   Social History Main Topics  . Smoking status: Never Smoker  . Smokeless tobacco: Never Used  . Alcohol use No  . Drug use: No  . Sexual activity: Not on file   Other Topics Concern  . Not on file   Social History Narrative   Widowed 2011   No living will   Would want daughter [name] to make health care decisions for her   Would want attempts at resuscitation   Probably would accept tube feeds   Review of Systems Eating well Weight is stabilized Doing well after the TKR-- no sig pain Wears seat belt Teeth okay-- regular with dentist (Dr Tamala Julian in [place]) Sleeps well Bowels are fine. No blood in stool Voids okay. Still has urge incontinence only first thing in AM. No problems through day. No suspicious skin lesions--no dermatologist    Objective:  Physical Exam  Constitutional: She is oriented to person, place, and time. She appears well-developed and well-nourished. No distress.  HENT:  Mouth/Throat: Oropharynx is clear and moist. No oropharyngeal exudate.  Neck: Normal range of motion. Neck supple. No thyromegaly present.  Cardiovascular: Normal rate, regular rhythm and intact distal pulses.  Exam reveals no gallop.   Slight aortic systolic murmur  Pulmonary/Chest: Effort normal and breath sounds normal. No respiratory distress. She has no wheezes. She has no rales.  Abdominal: Soft. There is no tenderness.  Musculoskeletal: She exhibits no edema.  Fairly normal passive  ROM of right shoulder Marked tenderness at triceps tendon insertion Bursa negative  Lymphadenopathy:    She has no cervical adenopathy.  Neurological: She is alert and oriented to person, place, and time.  President-- "Daisy Floro, Obama, Bush" [id number] D-l-r-o-w Recall 3/3  Skin: No rash noted. No erythema.  Psychiatric: She has a normal mood and affect. Her behavior is normal.          Assessment & Plan:

## 2016-08-06 NOTE — Assessment & Plan Note (Signed)
Discussed ice and tylenol

## 2016-08-06 NOTE — Assessment & Plan Note (Signed)
BP Readings from Last 3 Encounters:  08/06/16 130/72  04/19/16 110/72  04/13/16 120/90   Good control No change needed

## 2016-08-06 NOTE — Patient Instructions (Signed)
DASH Eating Plan DASH stands for "Dietary Approaches to Stop Hypertension." The DASH eating plan is a healthy eating plan that has been shown to reduce high blood pressure (hypertension). Additional health benefits may include reducing the risk of type 2 diabetes mellitus, heart disease, and stroke. The DASH eating plan may also help with weight loss. What do I need to know about the DASH eating plan? For the DASH eating plan, you will follow these general guidelines:  Choose foods with less than 150 milligrams of sodium per serving (as listed on the food label).  Use salt-free seasonings or herbs instead of table salt or sea salt.  Check with your health care provider or pharmacist before using salt substitutes.  Eat lower-sodium products. These are often labeled as "low-sodium" or "no salt added."  Eat fresh foods. Avoid eating a lot of canned foods.  Eat more vegetables, fruits, and low-fat dairy products.  Choose whole grains. Look for the word "whole" as the first word in the ingredient list.  Choose fish and skinless chicken or turkey more often than red meat. Limit fish, poultry, and meat to 6 oz (170 g) each day.  Limit sweets, desserts, sugars, and sugary drinks.  Choose heart-healthy fats.  Eat more home-cooked food and less restaurant, buffet, and fast food.  Limit fried foods.  Do not fry foods. Cook foods using methods such as baking, boiling, grilling, and broiling instead.  When eating at a restaurant, ask that your food be prepared with less salt, or no salt if possible. What foods can I eat? Seek help from a dietitian for individual calorie needs. Grains  Whole grain or whole wheat bread. Brown rice. Whole grain or whole wheat pasta. Quinoa, bulgur, and whole grain cereals. Low-sodium cereals. Corn or whole wheat flour tortillas. Whole grain cornbread. Whole grain crackers. Low-sodium crackers. Vegetables  Fresh or frozen vegetables (raw, steamed, roasted, or  grilled). Low-sodium or reduced-sodium tomato and vegetable juices. Low-sodium or reduced-sodium tomato sauce and paste. Low-sodium or reduced-sodium canned vegetables. Fruits  All fresh, canned (in natural juice), or frozen fruits. Meat and Other Protein Products  Ground beef (85% or leaner), grass-fed beef, or beef trimmed of fat. Skinless chicken or turkey. Ground chicken or turkey. Pork trimmed of fat. All fish and seafood. Eggs. Dried beans, peas, or lentils. Unsalted nuts and seeds. Unsalted canned beans. Dairy  Low-fat dairy products, such as skim or 1% milk, 2% or reduced-fat cheeses, low-fat ricotta or cottage cheese, or plain low-fat yogurt. Low-sodium or reduced-sodium cheeses. Fats and Oils  Tub margarines without trans fats. Light or reduced-fat mayonnaise and salad dressings (reduced sodium). Avocado. Safflower, olive, or canola oils. Natural peanut or almond butter. Other  Unsalted popcorn and pretzels. The items listed above may not be a complete list of recommended foods or beverages. Contact your dietitian for more options.  What foods are not recommended? Grains  White bread. White pasta. White rice. Refined cornbread. Bagels and croissants. Crackers that contain trans fat. Vegetables  Creamed or fried vegetables. Vegetables in a cheese sauce. Regular canned vegetables. Regular canned tomato sauce and paste. Regular tomato and vegetable juices. Fruits  Canned fruit in light or heavy syrup. Fruit juice. Meat and Other Protein Products  Fatty cuts of meat. Ribs, chicken wings, bacon, sausage, bologna, salami, chitterlings, fatback, hot dogs, bratwurst, and packaged luncheon meats. Salted nuts and seeds. Canned beans with salt. Dairy  Whole or 2% milk, cream, half-and-half, and cream cheese. Whole-fat or sweetened yogurt. Full-fat cheeses   or blue cheese. Nondairy creamers and whipped toppings. Processed cheese, cheese spreads, or cheese curds. Condiments  Onion and garlic  salt, seasoned salt, table salt, and sea salt. Canned and packaged gravies. Worcestershire sauce. Tartar sauce. Barbecue sauce. Teriyaki sauce. Soy sauce, including reduced sodium. Steak sauce. Fish sauce. Oyster sauce. Cocktail sauce. Horseradish. Ketchup and mustard. Meat flavorings and tenderizers. Bouillon cubes. Hot sauce. Tabasco sauce. Marinades. Taco seasonings. Relishes. Fats and Oils  Butter, stick margarine, lard, shortening, ghee, and bacon fat. Coconut, palm kernel, or palm oils. Regular salad dressings. Other  Pickles and olives. Salted popcorn and pretzels. The items listed above may not be a complete list of foods and beverages to avoid. Contact your dietitian for more information.  Where can I find more information? National Heart, Lung, and Blood Institute: www.nhlbi.nih.gov/health/health-topics/topics/dash/ This information is not intended to replace advice given to you by your health care provider. Make sure you discuss any questions you have with your health care provider. Document Released: 08/16/2011 Document Revised: 02/02/2016 Document Reviewed: 07/01/2013 Elsevier Interactive Patient Education  2017 Elsevier Inc.  

## 2016-08-07 LAB — CBC WITH DIFFERENTIAL/PLATELET
BASOS PCT: 0.6 % (ref 0.0–3.0)
Basophils Absolute: 0 10*3/uL (ref 0.0–0.1)
EOS PCT: 2.9 % (ref 0.0–5.0)
Eosinophils Absolute: 0.2 10*3/uL (ref 0.0–0.7)
HCT: 36.3 % (ref 36.0–46.0)
Hemoglobin: 12 g/dL (ref 12.0–15.0)
LYMPHS ABS: 2.4 10*3/uL (ref 0.7–4.0)
Lymphocytes Relative: 31.8 % (ref 12.0–46.0)
MCHC: 33.1 g/dL (ref 30.0–36.0)
MCV: 91 fl (ref 78.0–100.0)
MONOS PCT: 7.7 % (ref 3.0–12.0)
Monocytes Absolute: 0.6 10*3/uL (ref 0.1–1.0)
NEUTROS ABS: 4.3 10*3/uL (ref 1.4–7.7)
NEUTROS PCT: 57 % (ref 43.0–77.0)
PLATELETS: 324 10*3/uL (ref 150.0–400.0)
RBC: 3.98 Mil/uL (ref 3.87–5.11)
RDW: 13.2 % (ref 11.5–15.5)
WBC: 7.5 10*3/uL (ref 4.0–10.5)

## 2016-08-07 LAB — COMPREHENSIVE METABOLIC PANEL
ALT: 13 U/L (ref 0–35)
AST: 22 U/L (ref 0–37)
Albumin: 4 g/dL (ref 3.5–5.2)
Alkaline Phosphatase: 73 U/L (ref 39–117)
BUN: 23 mg/dL (ref 6–23)
CO2: 27 meq/L (ref 19–32)
Calcium: 9.7 mg/dL (ref 8.4–10.5)
Chloride: 102 mEq/L (ref 96–112)
Creatinine, Ser: 1.03 mg/dL (ref 0.40–1.20)
GFR: 55.4 mL/min — AB (ref >60.00)
GLUCOSE: 86 mg/dL (ref 70–99)
POTASSIUM: 4.1 meq/L (ref 3.5–5.1)
Sodium: 139 mEq/L (ref 135–145)
Total Bilirubin: 0.3 mg/dL (ref 0.2–1.2)
Total Protein: 7.1 g/dL (ref 6.0–8.3)

## 2016-08-07 LAB — LIPID PANEL
CHOL/HDL RATIO: 3
Cholesterol: 219 mg/dL — ABNORMAL HIGH (ref 0–200)
HDL: 81.6 mg/dL (ref >39.00)
LDL Cholesterol: 118 mg/dL — ABNORMAL HIGH (ref 0–99)
NONHDL: 137.48
Triglycerides: 99 mg/dL (ref 0.0–149.0)
VLDL: 19.8 mg/dL (ref 0.0–40.0)

## 2016-08-07 LAB — T4, FREE: Free T4: 0.95 ng/dL (ref 0.60–1.60)

## 2016-08-14 ENCOUNTER — Other Ambulatory Visit (INDEPENDENT_AMBULATORY_CARE_PROVIDER_SITE_OTHER): Payer: Medicare Other

## 2016-08-14 DIAGNOSIS — Z1211 Encounter for screening for malignant neoplasm of colon: Secondary | ICD-10-CM | POA: Diagnosis not present

## 2016-08-14 LAB — FECAL OCCULT BLOOD, IMMUNOCHEMICAL: Fecal Occult Bld: NEGATIVE

## 2016-08-20 ENCOUNTER — Other Ambulatory Visit: Payer: Self-pay | Admitting: Family Medicine

## 2016-10-05 IMAGING — CR DG KNEE 1-2V PORT*R*
1 series · 2 of 2 positions shown · non-contrast
Comparison: None.

CLINICAL DATA: Total knee arthroplasty.

EXAM:
PORTABLE RIGHT KNEE - 1-2 VIEW

[Series 1: ap · 0.17mm/px · 2 of 2 slices shown]
[im 1/2]
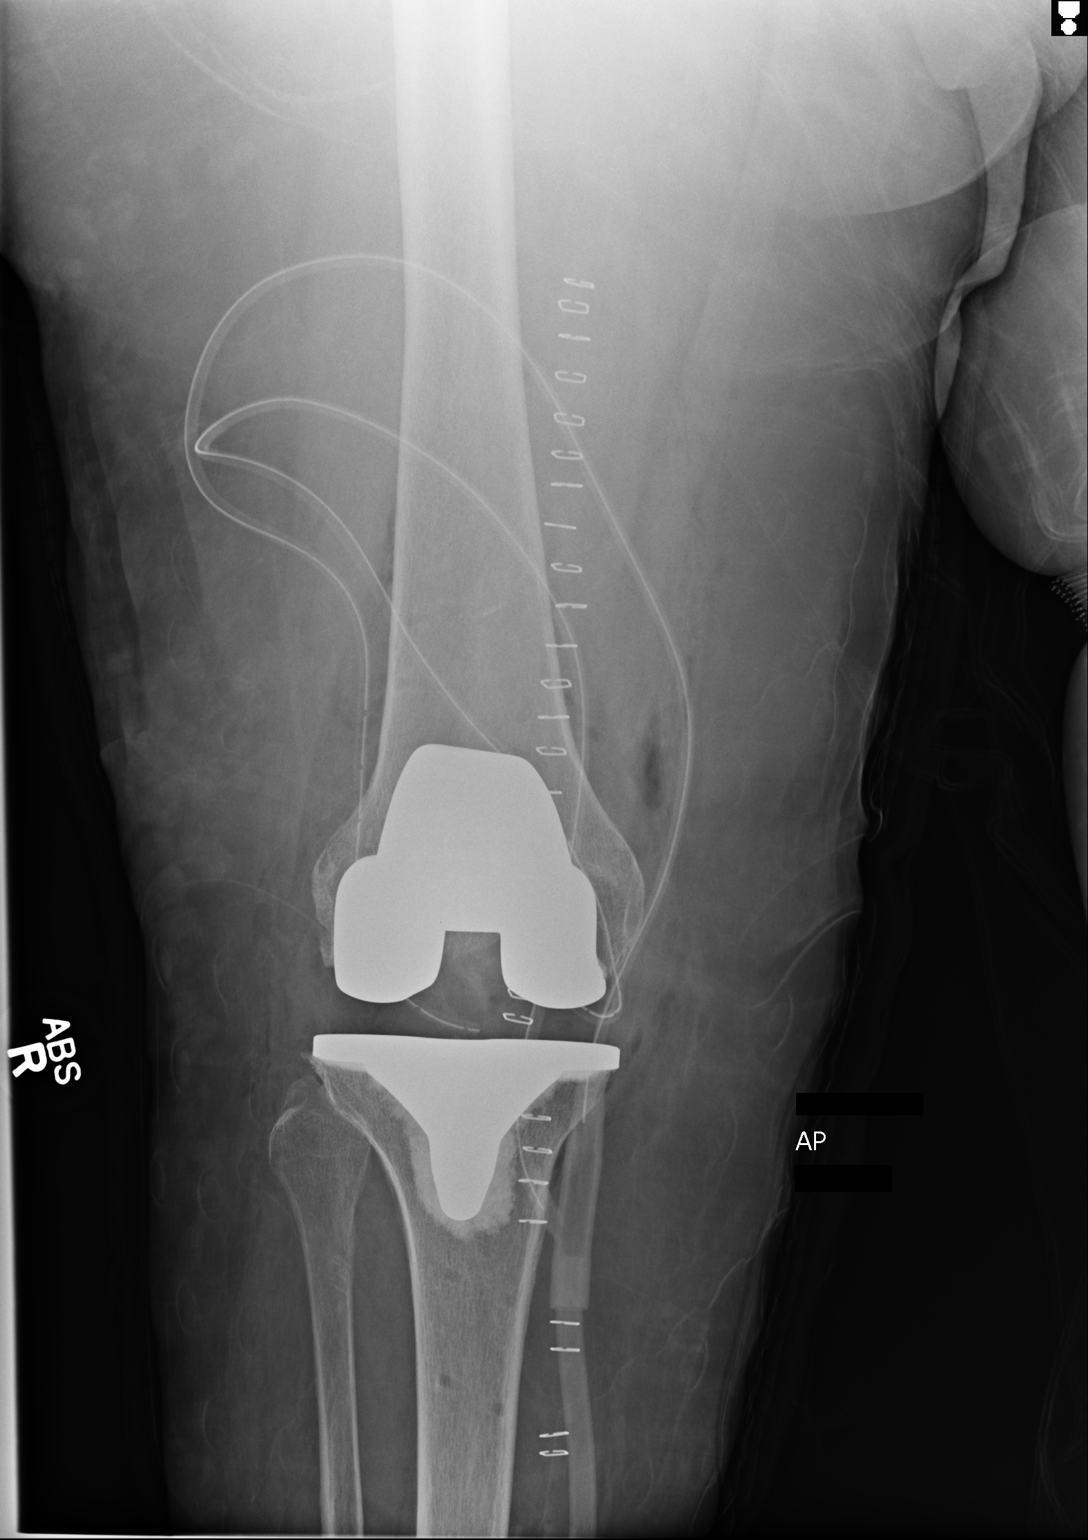
[im 2/2]
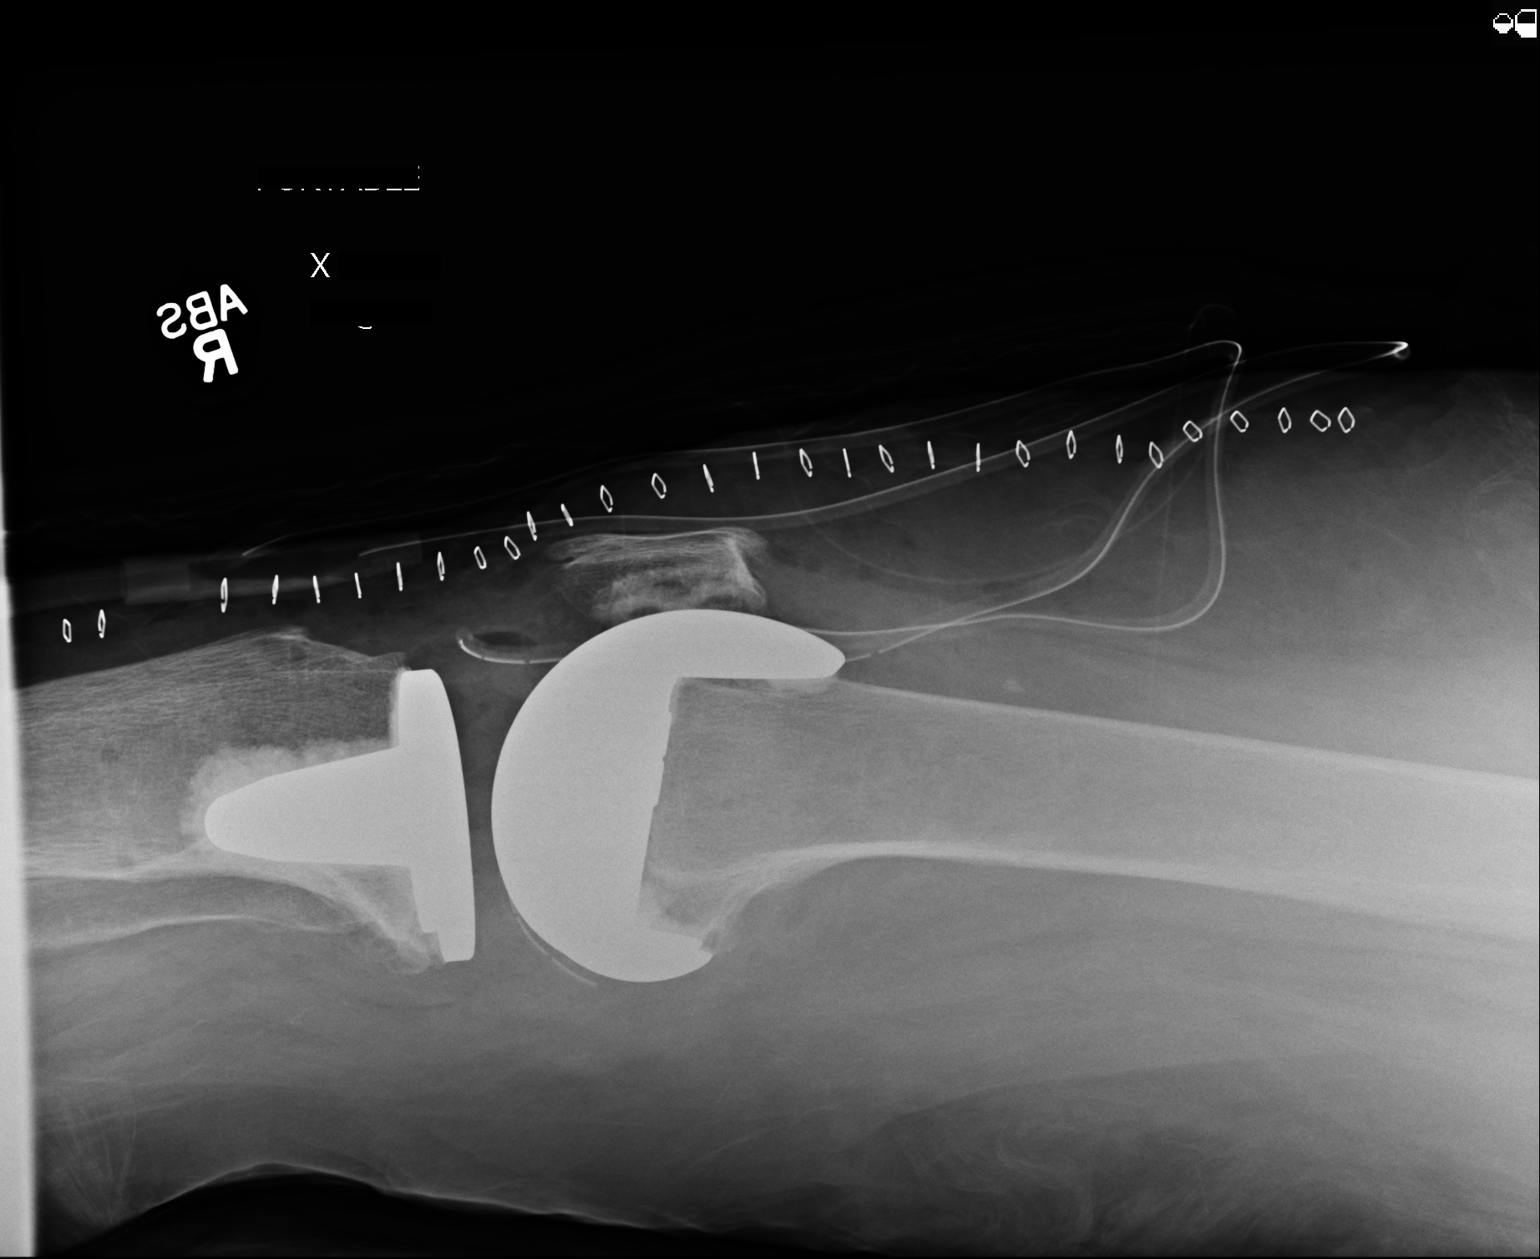

[2 of 2 positions shown; findings below may reference images not displayed]

FINDINGS: A right total knee arthroplasty is noted. The joint is located. A
symmetric spacing is present. Drain is in place. Skin staples are
noted.
IMPRESSION: Right total knee arthroplasty without radiographic evidence for
complication.

## 2017-05-07 DIAGNOSIS — Z96651 Presence of right artificial knee joint: Secondary | ICD-10-CM | POA: Diagnosis not present

## 2017-07-29 ENCOUNTER — Other Ambulatory Visit: Payer: Self-pay | Admitting: Internal Medicine

## 2017-08-09 ENCOUNTER — Ambulatory Visit (INDEPENDENT_AMBULATORY_CARE_PROVIDER_SITE_OTHER): Payer: Medicare Other | Admitting: Internal Medicine

## 2017-08-09 ENCOUNTER — Encounter: Payer: Self-pay | Admitting: Internal Medicine

## 2017-08-09 VITALS — BP 144/82 | HR 72 | Temp 98.0°F | Ht 61.5 in | Wt 236.5 lb

## 2017-08-09 DIAGNOSIS — Z6841 Body Mass Index (BMI) 40.0 and over, adult: Secondary | ICD-10-CM | POA: Diagnosis not present

## 2017-08-09 DIAGNOSIS — I10 Essential (primary) hypertension: Secondary | ICD-10-CM

## 2017-08-09 DIAGNOSIS — Z7189 Other specified counseling: Secondary | ICD-10-CM

## 2017-08-09 DIAGNOSIS — Z0000 Encounter for general adult medical examination without abnormal findings: Secondary | ICD-10-CM

## 2017-08-09 DIAGNOSIS — L409 Psoriasis, unspecified: Secondary | ICD-10-CM

## 2017-08-09 DIAGNOSIS — Z23 Encounter for immunization: Secondary | ICD-10-CM | POA: Diagnosis not present

## 2017-08-09 LAB — CBC
HCT: 41.3 % (ref 36.0–46.0)
Hemoglobin: 13.3 g/dL (ref 12.0–15.0)
MCHC: 32.2 g/dL (ref 30.0–36.0)
MCV: 92.7 fl (ref 78.0–100.0)
Platelets: 299 10*3/uL (ref 150.0–400.0)
RBC: 4.46 Mil/uL (ref 3.87–5.11)
RDW: 13.2 % (ref 11.5–15.5)
WBC: 8.3 10*3/uL (ref 4.0–10.5)

## 2017-08-09 LAB — COMPREHENSIVE METABOLIC PANEL
ALK PHOS: 77 U/L (ref 39–117)
ALT: 14 U/L (ref 0–35)
AST: 22 U/L (ref 0–37)
Albumin: 4.2 g/dL (ref 3.5–5.2)
BUN: 28 mg/dL — AB (ref 6–23)
CHLORIDE: 104 meq/L (ref 96–112)
CO2: 30 meq/L (ref 19–32)
Calcium: 10.3 mg/dL (ref 8.4–10.5)
Creatinine, Ser: 0.98 mg/dL (ref 0.40–1.20)
GFR: 58.52 mL/min — AB (ref >60.00)
GLUCOSE: 105 mg/dL — AB (ref 70–99)
POTASSIUM: 5.1 meq/L (ref 3.5–5.1)
SODIUM: 142 meq/L (ref 135–145)
TOTAL PROTEIN: 7.5 g/dL (ref 6.0–8.3)
Total Bilirubin: 0.5 mg/dL (ref 0.2–1.2)

## 2017-08-09 MED ORDER — CLOBETASOL PROPIONATE 0.05 % EX CREA: 1.0000 "application " | TOPICAL_CREAM | Freq: Two times a day (BID) | CUTANEOUS | 3 refills | Status: DC | PRN

## 2017-08-09 NOTE — Assessment & Plan Note (Signed)
Discussed exercise DASH info given Goal lose 10# in next year

## 2017-08-09 NOTE — Progress Notes (Signed)
Subjective:    Patient ID: Brooke Goodman, female    DOB: March 31, 1941, 76 y.o.   MRN: [id number]  HPI Visit for Medicare wellness visit and follow up of chronic medical conditions Reviewed form and advanced directives Reviewed other doctors No alcohol or tobacco Tries to walk some--not enough Tripped pulling something into her house--no injury Vision and hearing are fine No depression or anhedonia Independent with instrumental ADLs No sig memory problems  Monitors her BP 133/66 this morning at home No chest pain No SOB No dizziness or syncope No headache  No edema  Not really consistent with healthy eating Tries to limit carbs and increase fruits  Some joint pains Uses tylenol or advil sporadically  Psoriasis persists Needs refill on clobetasol  Current Outpatient Medications on File Prior to Visit  Medication Sig Dispense Refill  . Cholecalciferol (VITAMIN D3) 1000 units CAPS Take by mouth.    . clobetasol cream (TEMOVATE) 0.62 % Apply 1 application topically 2 (two) times daily. (Patient taking differently: Apply 1 application topically 2 (two) times daily as needed. ) 60 g 1  . Cyanocobalamin (VITAMIN B-12 PO) Take by mouth.    . Multiple Vitamin (MULTIVITAMIN) tablet Take 1 tablet by mouth daily.    . Probiotic Product (PROBIOTIC DAILY PO) Take 1 tablet by mouth 2 (two) times daily.    Marland Kitchen triamterene-hydrochlorothiazide (MAXZIDE-25) 37.5-25 MG tablet TAKE 1 TABLET BY MOUTH  DAILY 90 tablet 0   No current facility-administered medications on file prior to visit.     No Known Allergies  Past Medical History:  Diagnosis Date  . Diverticulitis   . Hypertension   . Osteoarthritis   . Psoriasis   . Restless leg syndrome   . Urinary incontinence     Past Surgical History:  Procedure Laterality Date  . TENDON RELEASE  07/98   right thumb  . 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 Coronary artery disease Sister   . Cancer Sister        breast  . Hypertension Neg Hx   . Diabetes Neg Hx     Social History   Socioeconomic History  . Marital status: Widowed    Spouse name: Not on file  . Number of children: 3  . Years of education: Not on file  . Highest education level: Not on file  Social Needs  . Financial resource strain: Not on file  . Food insecurity - worry: Not on file  . Food insecurity - inability: Not on file  . Transportation needs - medical: Not on file  . Transportation needs - non-medical: Not on file  Occupational History  . Occupation: Public affairs consultant    Comment: retired 2016  Tobacco Use  . Smoking status: Never Smoker  . Smokeless tobacco: Never Used  Substance and Sexual Activity  . Alcohol use: No  . Drug use: No  . Sexual activity: Not on file  Other Topics Concern  . Not on file  Social History Narrative   Widowed 2011   No living will   Would want daughter Brooke Goodman to make health care decisions for her. Son would be next   Would want attempts at resuscitation   Probably would accept tube feeds   Review  of Systems Weight is up a few pounds Appetite is fine Sleeps well Had tooth extraction--will be getting partial Wears seat belt Bowels fine. No blood. Voids okay--but some urge incontinence (wears pad) No suspicious skin lesions Rare heartburn--no dysphagia. Uses tums occasionally    Objective:   Physical Exam  Constitutional: She is oriented to person, place, and time. No distress.  HENT:  Mouth/Throat: Oropharynx is clear and moist. No oropharyngeal exudate.  Upper plate  Neck: No thyromegaly present.  Cardiovascular: Normal rate, regular rhythm, normal heart sounds and intact distal pulses. Exam reveals no gallop.  No murmur heard. Pulmonary/Chest: Effort normal and  breath sounds normal. No respiratory distress. She has no wheezes. She has no rales.  Abdominal: Soft. There is no tenderness.  Musculoskeletal: She exhibits no edema or tenderness.  Lymphadenopathy:    She has no cervical adenopathy.  Neurological: She is alert and oriented to person, place, and time.  President-- "Daisy Floro, Obama, Bush" [phone] D-l-r-o-w Recall 3/3  Skin: No rash noted. No erythema.  Psychiatric: She has a normal mood and affect. Her behavior is normal.          Assessment & Plan:

## 2017-08-09 NOTE — Addendum Note (Signed)
Addended by: Modena Nunnery on: 08/09/2017 11:31 AM   Modules accepted: Orders

## 2017-08-09 NOTE — Assessment & Plan Note (Signed)
Mild Okay with topical Rx

## 2017-08-09 NOTE — Assessment & Plan Note (Signed)
BP Readings from Last 3 Encounters:  08/09/17 (!) 144/82  08/06/16 130/72  04/19/16 110/72   Had stressful trip here--traffic, etc Fine at home No changes

## 2017-08-09 NOTE — Progress Notes (Signed)
Hearing Screening   125Hz  250Hz  500Hz  1000Hz  2000Hz  3000Hz  4000Hz  6000Hz  8000Hz   Right ear:   25 25 25  25     Left ear:   25 25 25  25       Visual Acuity Screening   Right eye Left eye Both eyes  Without correction:     With correction: 20/40 20/40 20/30

## 2017-08-09 NOTE — Assessment & Plan Note (Signed)
Plans to get form notarized

## 2017-08-09 NOTE — Assessment & Plan Note (Signed)
I have personally reviewed the Medicare Annual Wellness questionnaire and have noted 1. The patient's medical and social history 2. Their use of alcohol, tobacco or illicit drugs 3. Their current medications and supplements 4. The patient's functional ability including ADL's, fall risks, home safety risks and hearing or visual             impairment. 5. Diet and physical activities 6. Evidence for depression or mood disorders  The patients weight, height, BMI and visual acuity have been recorded in the chart I have made referrals, counseling and provided education to the patient based review of the above and I have provided the pt with a written personalized care plan for preventive services.  I have provided you with a copy of your personalized plan for preventive services. Please take the time to review along with your updated medication list.  Had flu vaccine Will give pneumovax booster Discussed exercise DASH eating FIT Mammogram due 2/19

## 2017-08-09 NOTE — Patient Instructions (Signed)
DASH Eating Plan DASH stands for "Dietary Approaches to Stop Hypertension." The DASH eating plan is a healthy eating plan that has been shown to reduce high blood pressure (hypertension). It may also reduce your risk for type 2 diabetes, heart disease, and stroke. The DASH eating plan may also help with weight loss. What are tips for following this plan? General guidelines  Avoid eating more than 2,300 mg (milligrams) of salt (sodium) a day. If you have hypertension, you may need to reduce your sodium intake to 1,500 mg a day.  Limit alcohol intake to no more than 1 drink a day for nonpregnant women and 2 drinks a day for men. One drink equals 12 oz of beer, 5 oz of wine, or 1 oz of hard liquor.  Work with your health care provider to maintain a healthy body weight or to lose weight. Ask what an ideal weight is for you.  Get at least 30 minutes of exercise that causes your heart to beat faster (aerobic exercise) most days of the week. Activities may include walking, swimming, or biking.  Work with your health care provider or diet and nutrition specialist (dietitian) to adjust your eating plan to your individual calorie needs. Reading food labels  Check food labels for the amount of sodium per serving. Choose foods with less than 5 percent of the Daily Value of sodium. Generally, foods with less than 300 mg of sodium per serving fit into this eating plan.  To find whole grains, look for the word "whole" as the first word in the ingredient list. Shopping  Buy products labeled as "low-sodium" or "no salt added."  Buy fresh foods. Avoid canned foods and premade or frozen meals. Cooking  Avoid adding salt when cooking. Use salt-free seasonings or herbs instead of table salt or sea salt. Check with your health care provider or pharmacist before using salt substitutes.  Do not fry foods. Cook foods using healthy methods such as baking, boiling, grilling, and broiling instead.  Cook with  heart-healthy oils, such as olive, canola, soybean, or sunflower oil. Meal planning   Eat a balanced diet that includes: ? 5 or more servings of fruits and vegetables each day. At each meal, try to fill half of your plate with fruits and vegetables. ? Up to 6-8 servings of whole grains each day. ? Less than 6 oz of lean meat, poultry, or fish each day. A 3-oz serving of meat is about the same size as a deck of cards. One egg equals 1 oz. ? 2 servings of low-fat dairy each day. ? A serving of nuts, seeds, or beans 5 times each week. ? Heart-healthy fats. Healthy fats called Omega-3 fatty acids are found in foods such as flaxseeds and coldwater fish, like sardines, salmon, and mackerel.  Limit how much you eat of the following: ? Canned or prepackaged foods. ? Food that is high in trans fat, such as fried foods. ? Food that is high in saturated fat, such as fatty meat. ? Sweets, desserts, sugary drinks, and other foods with added sugar. ? Full-fat dairy products.  Do not salt foods before eating.  Try to eat at least 2 vegetarian meals each week.  Eat more home-cooked food and less restaurant, buffet, and fast food.  When eating at a restaurant, ask that your food be prepared with less salt or no salt, if possible. What foods are recommended? The items listed may not be a complete list. Talk with your dietitian about what   dietary choices are best for you. Grains Whole-grain or whole-wheat bread. Whole-grain or whole-wheat pasta. Brown rice. Oatmeal. Quinoa. Bulgur. Whole-grain and low-sodium cereals. Pita bread. Low-fat, low-sodium crackers. Whole-wheat flour tortillas. Vegetables Fresh or frozen vegetables (raw, steamed, roasted, or grilled). Low-sodium or reduced-sodium tomato and vegetable juice. Low-sodium or reduced-sodium tomato sauce and tomato paste. Low-sodium or reduced-sodium canned vegetables. Fruits All fresh, dried, or frozen fruit. Canned fruit in natural juice (without  added sugar). Meat and other protein foods Skinless chicken or turkey. Ground chicken or turkey. Pork with fat trimmed off. Fish and seafood. Egg whites. Dried beans, peas, or lentils. Unsalted nuts, nut butters, and seeds. Unsalted canned beans. Lean cuts of beef with fat trimmed off. Low-sodium, lean deli meat. Dairy Low-fat (1%) or fat-free (skim) milk. Fat-free, low-fat, or reduced-fat cheeses. Nonfat, low-sodium ricotta or cottage cheese. Low-fat or nonfat yogurt. Low-fat, low-sodium cheese. Fats and oils Soft margarine without trans fats. Vegetable oil. Low-fat, reduced-fat, or light mayonnaise and salad dressings (reduced-sodium). Canola, safflower, olive, soybean, and sunflower oils. Avocado. Seasoning and other foods Herbs. Spices. Seasoning mixes without salt. Unsalted popcorn and pretzels. Fat-free sweets. What foods are not recommended? The items listed may not be a complete list. Talk with your dietitian about what dietary choices are best for you. Grains Baked goods made with fat, such as croissants, muffins, or some breads. Dry pasta or rice meal packs. Vegetables Creamed or fried vegetables. Vegetables in a cheese sauce. Regular canned vegetables (not low-sodium or reduced-sodium). Regular canned tomato sauce and paste (not low-sodium or reduced-sodium). Regular tomato and vegetable juice (not low-sodium or reduced-sodium). Pickles. Olives. Fruits Canned fruit in a light or heavy syrup. Fried fruit. Fruit in cream or butter sauce. Meat and other protein foods Fatty cuts of meat. Ribs. Fried meat. Bacon. Sausage. Bologna and other processed lunch meats. Salami. Fatback. Hotdogs. Bratwurst. Salted nuts and seeds. Canned beans with added salt. Canned or smoked fish. Whole eggs or egg yolks. Chicken or turkey with skin. Dairy Whole or 2% milk, cream, and half-and-half. Whole or full-fat cream cheese. Whole-fat or sweetened yogurt. Full-fat cheese. Nondairy creamers. Whipped toppings.  Processed cheese and cheese spreads. Fats and oils Butter. Stick margarine. Lard. Shortening. Ghee. Bacon fat. Tropical oils, such as coconut, palm kernel, or palm oil. Seasoning and other foods Salted popcorn and pretzels. Onion salt, garlic salt, seasoned salt, table salt, and sea salt. Worcestershire sauce. Tartar sauce. Barbecue sauce. Teriyaki sauce. Soy sauce, including reduced-sodium. Steak sauce. Canned and packaged gravies. Fish sauce. Oyster sauce. Cocktail sauce. Horseradish that you find on the shelf. Ketchup. Mustard. Meat flavorings and tenderizers. Bouillon cubes. Hot sauce and Tabasco sauce. Premade or packaged marinades. Premade or packaged taco seasonings. Relishes. Regular salad dressings. Where to find more information:  National Heart, Lung, and Blood Institute: www.nhlbi.nih.gov  American Heart Association: www.heart.org Summary  The DASH eating plan is a healthy eating plan that has been shown to reduce high blood pressure (hypertension). It may also reduce your risk for type 2 diabetes, heart disease, and stroke.  With the DASH eating plan, you should limit salt (sodium) intake to 2,300 mg a day. If you have hypertension, you may need to reduce your sodium intake to 1,500 mg a day.  When on the DASH eating plan, aim to eat more fresh fruits and vegetables, whole grains, lean proteins, low-fat dairy, and heart-healthy fats.  Work with your health care provider or diet and nutrition specialist (dietitian) to adjust your eating plan to your individual   calorie needs. This information is not intended to replace advice given to you by your health care provider. Make sure you discuss any questions you have with your health care provider. Document Released: 08/16/2011 Document Revised: 08/20/2016 Document Reviewed: 08/20/2016 Elsevier Interactive Patient Education  2017 [employer] Inc.  

## 2017-08-16 ENCOUNTER — Other Ambulatory Visit (INDEPENDENT_AMBULATORY_CARE_PROVIDER_SITE_OTHER): Payer: Medicare Other

## 2017-08-16 ENCOUNTER — Other Ambulatory Visit: Payer: Self-pay | Admitting: Internal Medicine

## 2017-08-16 DIAGNOSIS — Z1211 Encounter for screening for malignant neoplasm of colon: Secondary | ICD-10-CM

## 2017-08-16 LAB — FECAL OCCULT BLOOD, IMMUNOCHEMICAL: Fecal Occult Bld: NEGATIVE

## 2017-08-21 ENCOUNTER — Encounter: Payer: Self-pay | Admitting: *Deleted

## 2017-09-18 IMAGING — CT CT ABD-PELV W/ CM
2 of 5 series · 16 of 46 positions shown, 18 images · IV contrast (APPLIED)
Comparison: None.

CLINICAL DATA: Lower abdominal pain and diarrhea for 2 weeks. Loss
of appetite.

EXAM:
CT ABDOMEN AND PELVIS WITH CONTRAST
TECHNIQUE: Multidetector CT imaging of the abdomen and pelvis was performed
using the standard protocol following bolus administration of
intravenous contrast.
CONTRAST:  80mL 7X3SYS-100 IOPAMIDOL (7X3SYS-100) INJECTION 61%

[Series 2: axial st · axial · 0.84mm/px · z∈[-583,-183]mm · 13 of 92 slices shown, 15 images]
[im 6/92  soft-tissue]
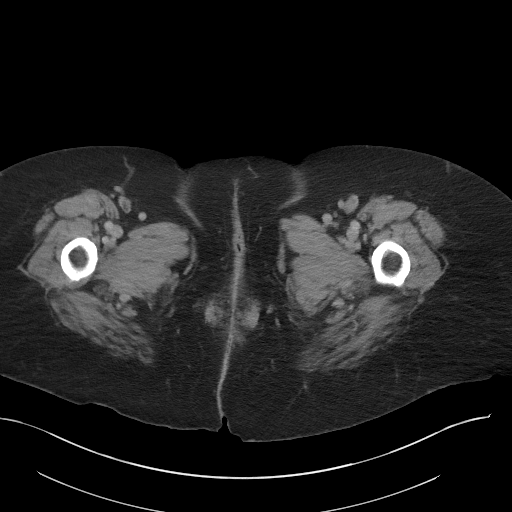
[im 6/92  bone]
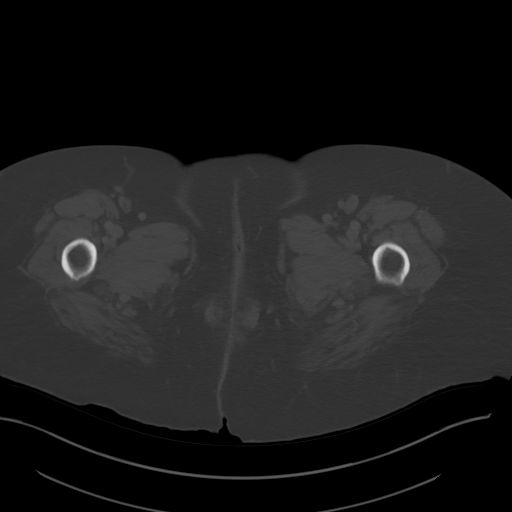
[im 11/92  soft-tissue]
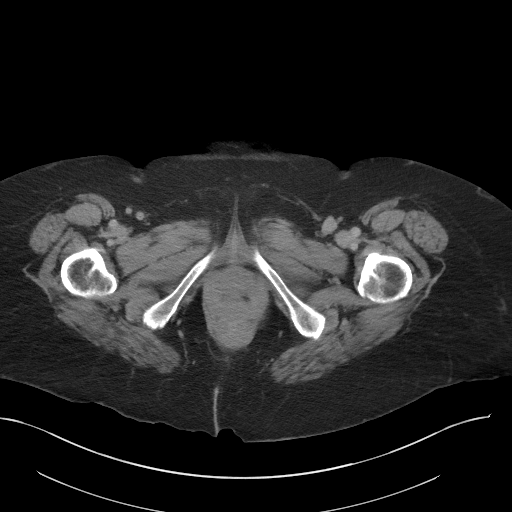
[im 21/92  soft-tissue]
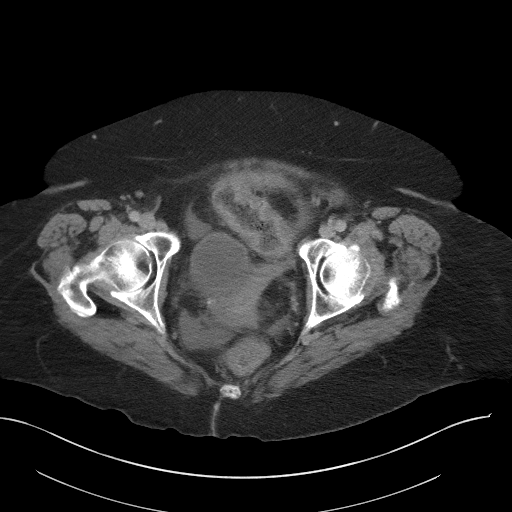
[im 26/92  soft-tissue]
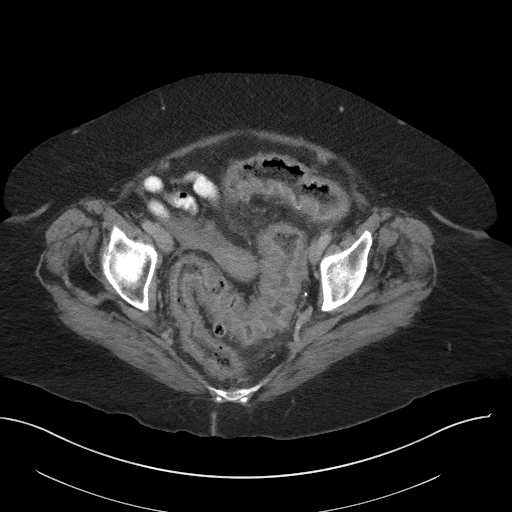
[im 31/92  soft-tissue]
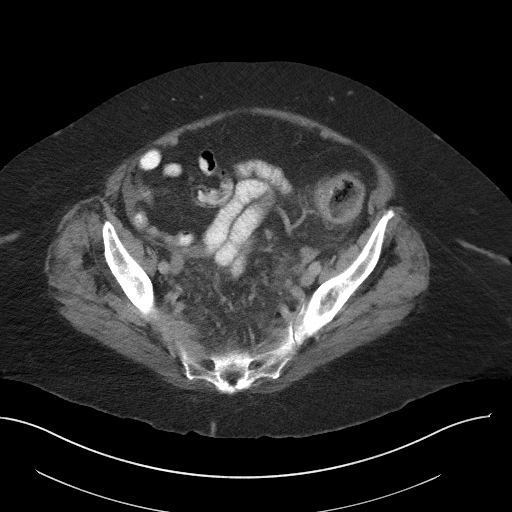
[im 41/92  soft-tissue]
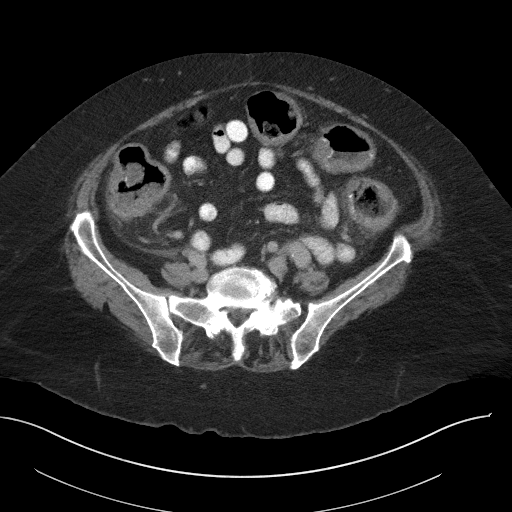
[im 46/92  soft-tissue]
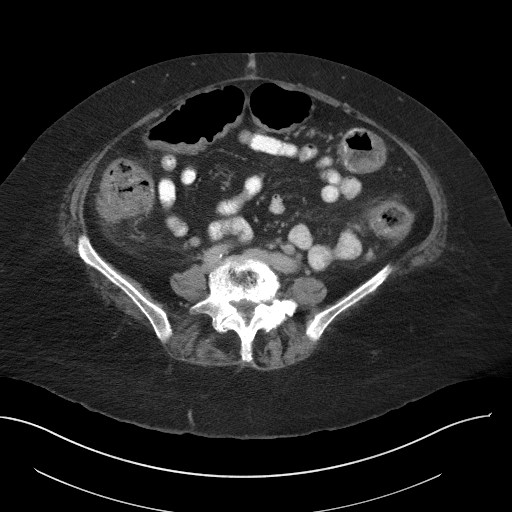
[im 51/92  soft-tissue]
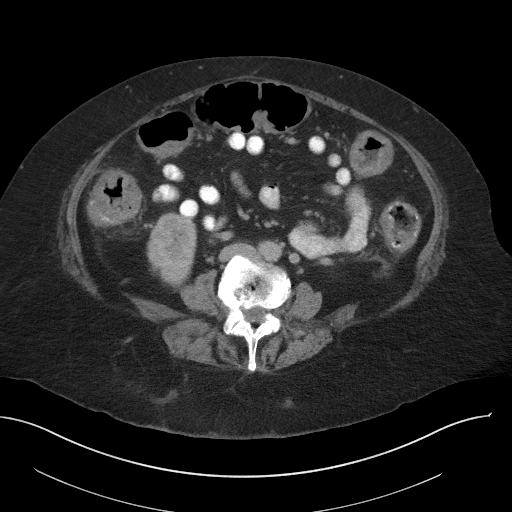
[im 61/92  soft-tissue]
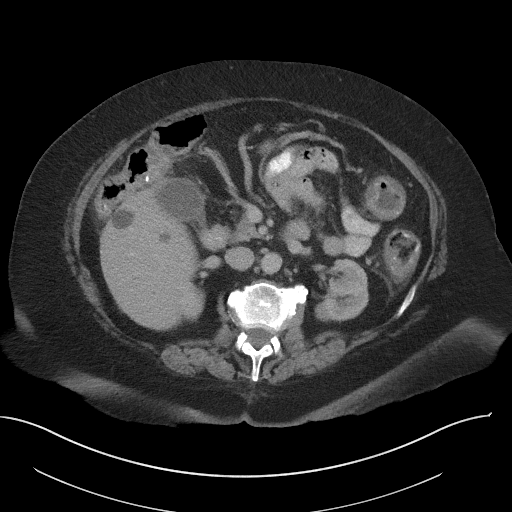
[im 61/92  bone]
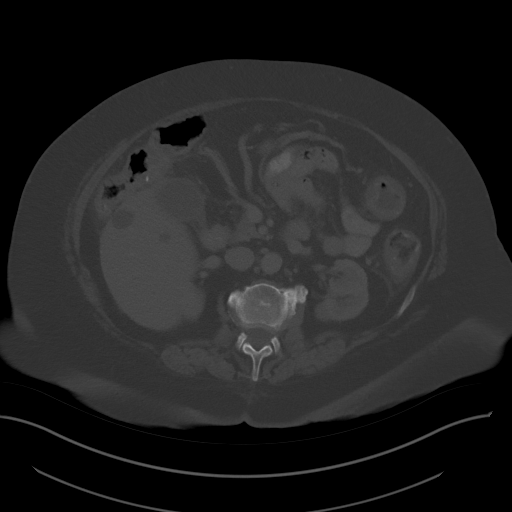
[im 66/92  soft-tissue]
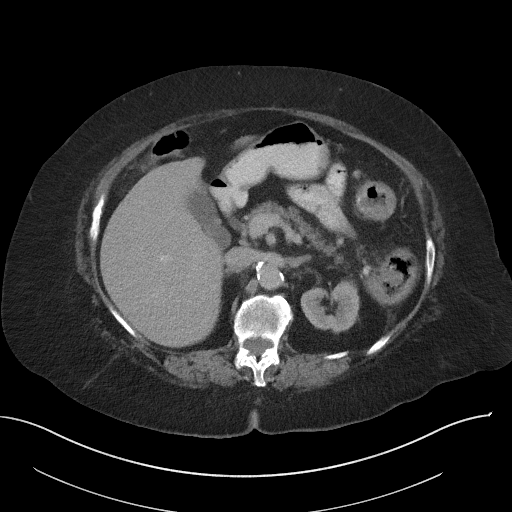
[im 71/92  soft-tissue]
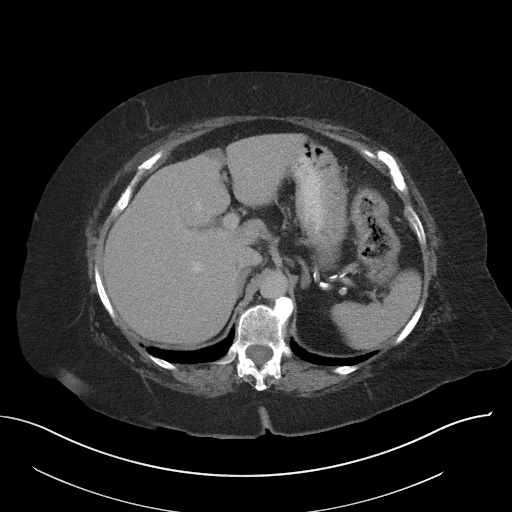
[im 81/92  soft-tissue]
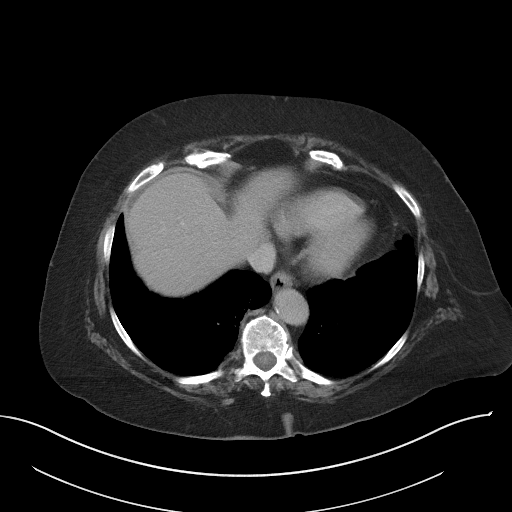
[im 86/92  soft-tissue]
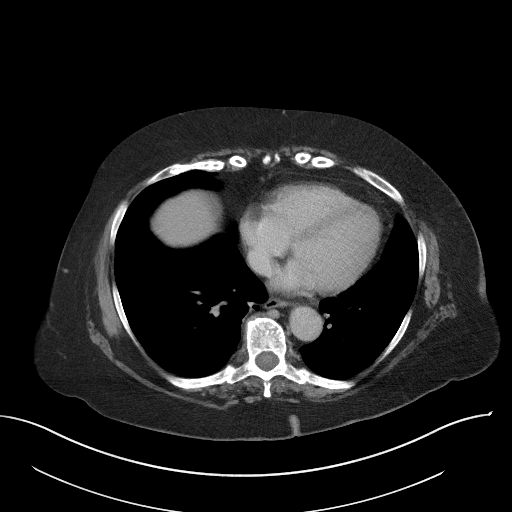

[Series 5: coronal st · coronal · 0.85mm/px · 3 of 103 slices shown]
[im 35/103  soft-tissue]
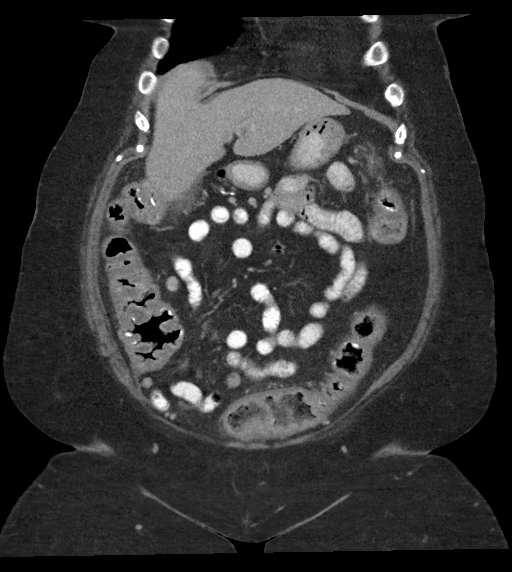
[im 46/103  soft-tissue]
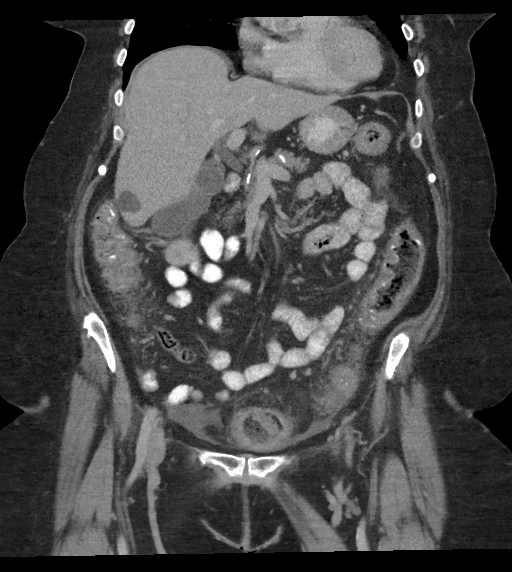
[im 57/103  soft-tissue]
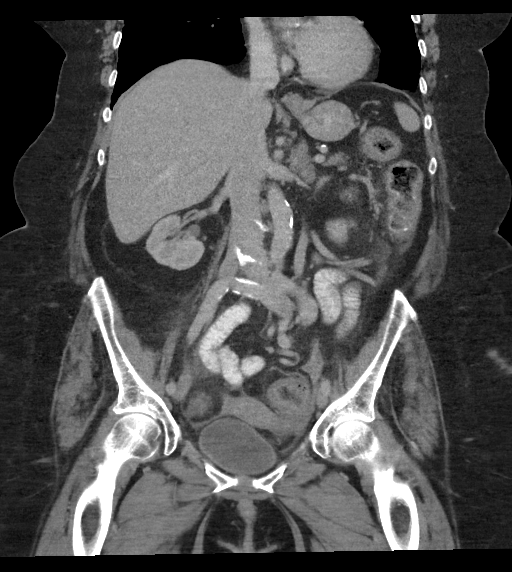

[16 of 46 positions shown; findings below may reference images not displayed]

FINDINGS: Lower chest: No pulmonary nodules, pleural effusions, or
infiltrates. Coronary artery calcifications are present.

Hepatobiliary: There is focal fat attenuation adjacent to the
falciform ligaments. Low-attenuation lesions within the right
hepatic lobe are most consistent with cysts, largest measuring
cm. The gallbladder is present. There is a small amount of
pericholecystic fluid.

Pancreas: Normal in appearance.

Spleen: Normal in appearance.

Renal/Adrenal: Adrenal glands are normal in appearance. Kidneys are
normal in appearance. No focal renal mass or hydronephrosis.

Gastrointestinal tract: Small hiatal type hernia. Small bowel loops
are normal in appearance. The appendix is well seen and has a normal
appearance.

There is diffusely abnormal colon. The wall of the colon is
thickened and there is mucosal enhancement. These changes affect the
entire colon from the cecum to the rectum. There are scattered
diverticula.

Reproductive/Pelvis: Uterus is present. No adnexal mass. There is a
small amount of free pelvic fluid.

Vascular/Lymphatic: Atherosclerotic calcification in the abdominal
aorta.

Musculoskeletal/Abdominal wall: Degenerative changes are identified
in the lower thoracic and lumbar spine.

Other: none
IMPRESSION: 1. Pancolitis. Findings are consistent with infectious, inflammatory
process. Less likely the findings could be related to ischemic
causes. Although there are colonic diverticula, the process is in
consistent with acute diverticulitis.
2. Small amount of pelvic ascites.
3. Pericholecystic fluid, likely secondary to the colitis rather
than primary process involving the gallbladder.
4. Focal fatty infiltration of the liver.
5. Small liver cysts.
6. Hiatal hernia.
7. Normal appendix.

## 2017-11-25 ENCOUNTER — Other Ambulatory Visit: Payer: Self-pay | Admitting: Internal Medicine

## 2018-04-23 ENCOUNTER — Other Ambulatory Visit: Payer: Self-pay | Admitting: Internal Medicine

## 2018-04-23 DIAGNOSIS — Z1231 Encounter for screening mammogram for malignant neoplasm of breast: Secondary | ICD-10-CM

## 2018-05-01 DIAGNOSIS — Z96651 Presence of right artificial knee joint: Secondary | ICD-10-CM | POA: Diagnosis not present

## 2018-05-06 ENCOUNTER — Ambulatory Visit
Admission: RE | Admit: 2018-05-06 | Discharge: 2018-05-06 | Disposition: A | Discharge status: Home / Self Care | Payer: Medicare Other | Source: Ambulatory Visit | Attending: Internal Medicine | Admitting: Internal Medicine

## 2018-05-06 DIAGNOSIS — Z1231 Encounter for screening mammogram for malignant neoplasm of breast: Secondary | ICD-10-CM | POA: Diagnosis not present

## 2018-06-18 ENCOUNTER — Other Ambulatory Visit: Payer: Self-pay | Admitting: Internal Medicine

## 2018-08-13 ENCOUNTER — Ambulatory Visit (INDEPENDENT_AMBULATORY_CARE_PROVIDER_SITE_OTHER): Payer: Medicare Other | Admitting: Internal Medicine

## 2018-08-13 ENCOUNTER — Encounter: Payer: Self-pay | Admitting: Internal Medicine

## 2018-08-13 VITALS — BP 120/76 | HR 61 | Temp 98.0°F | Ht 61.5 in | Wt 240.0 lb

## 2018-08-13 DIAGNOSIS — Z7189 Other specified counseling: Secondary | ICD-10-CM

## 2018-08-13 DIAGNOSIS — I10 Essential (primary) hypertension: Secondary | ICD-10-CM

## 2018-08-13 DIAGNOSIS — N3941 Urge incontinence: Secondary | ICD-10-CM

## 2018-08-13 DIAGNOSIS — L409 Psoriasis, unspecified: Secondary | ICD-10-CM

## 2018-08-13 DIAGNOSIS — Z0000 Encounter for general adult medical examination without abnormal findings: Secondary | ICD-10-CM | POA: Diagnosis not present

## 2018-08-13 DIAGNOSIS — Z1211 Encounter for screening for malignant neoplasm of colon: Secondary | ICD-10-CM

## 2018-08-13 LAB — LIPID PANEL
Cholesterol: 202 mg/dL — ABNORMAL HIGH (ref 0–200)
HDL: 75.1 mg/dL (ref >39.00)
LDL Cholesterol: 111 mg/dL — ABNORMAL HIGH (ref 0–99)
NONHDL: 126.47
Total CHOL/HDL Ratio: 3
Triglycerides: 79 mg/dL (ref 0.0–149.0)
VLDL: 15.8 mg/dL (ref 0.0–40.0)

## 2018-08-13 LAB — CBC
HCT: 41.4 % (ref 36.0–46.0)
Hemoglobin: 13.6 g/dL (ref 12.0–15.0)
MCHC: 32.9 g/dL (ref 30.0–36.0)
MCV: 91.2 fl (ref 78.0–100.0)
Platelets: 308 10*3/uL (ref 150.0–400.0)
RBC: 4.54 Mil/uL (ref 3.87–5.11)
RDW: 12.9 % (ref 11.5–15.5)
WBC: 8 10*3/uL (ref 4.0–10.5)

## 2018-08-13 LAB — COMPREHENSIVE METABOLIC PANEL
ALBUMIN: 4.2 g/dL (ref 3.5–5.2)
ALT: 15 U/L (ref 0–35)
AST: 23 U/L (ref 0–37)
Alkaline Phosphatase: 85 U/L (ref 39–117)
BILIRUBIN TOTAL: 0.3 mg/dL (ref 0.2–1.2)
BUN: 24 mg/dL — AB (ref 6–23)
CALCIUM: 9.6 mg/dL (ref 8.4–10.5)
CHLORIDE: 101 meq/L (ref 96–112)
CO2: 27 meq/L (ref 19–32)
Creatinine, Ser: 0.98 mg/dL (ref 0.40–1.20)
GFR: 58.36 mL/min — ABNORMAL LOW (ref >60.00)
Glucose, Bld: 106 mg/dL — ABNORMAL HIGH (ref 70–99)
Potassium: 4.3 mEq/L (ref 3.5–5.1)
SODIUM: 139 meq/L (ref 135–145)
Total Protein: 7.6 g/dL (ref 6.0–8.3)

## 2018-08-13 NOTE — Assessment & Plan Note (Signed)
Topical Rx only

## 2018-08-13 NOTE — Assessment & Plan Note (Signed)
BP Readings from Last 3 Encounters:  08/13/18 120/76  08/09/17 (!) 144/82  08/06/16 130/72   Good control Due for labs

## 2018-08-13 NOTE — Assessment & Plan Note (Signed)
She needs more self control Discussed muscle strengthening

## 2018-08-13 NOTE — Progress Notes (Signed)
Subjective:    Patient ID: Brooke Goodman, female    DOB: Sep 03, 1941, 77 y.o.   MRN: [id number]  HPI Here for Medicare wellness visit and follow up of chronic health conditions Reviewed form and advanced directives Reviewed other doctors No alcohol or tobacco No exercise---discussed Vision off some---needs new glasses Hearing is fine No falls--but worries about this No depression or anhedonia Independent with instrumental ADLs No memory problems  Generally feels well Sinus headache this morning--not typical No allergy problems  No problems with BP med No chest pain No SOB No dizziness  No edema  Has gained more weight  Weakness is sweets--discussed Weight is highest ever now!  Some urge incontinence Usually in bed Wears pad  Mild elevated cholesterol No Rx  Mild psoriasis Uses topicals only Not a big deal  Current Outpatient Medications on File Prior to Visit  Medication Sig Dispense Refill  . Cholecalciferol (VITAMIN D3) 1000 units CAPS Take by mouth.    . clobetasol cream (TEMOVATE) 7.02 % Apply 1 application topically 2 (two) times daily as needed. 60 g 3  . Multiple Vitamin (MULTIVITAMIN) tablet Take 1 tablet by mouth daily.    . Probiotic Product (PROBIOTIC DAILY PO) Take 1 tablet by mouth 2 (two) times daily.    Marland Kitchen triamterene-hydrochlorothiazide (MAXZIDE-25) 37.5-25 MG tablet TAKE 1 TABLET BY MOUTH  DAILY 90 tablet 0   No current facility-administered medications on file prior to visit.     No Known Allergies  Past Medical History:  Diagnosis Date  . Diverticulitis   . Hypertension   . Osteoarthritis   . Psoriasis   . Restless leg syndrome   . Urinary incontinence     Past Surgical History:  Procedure Laterality Date  . TENDON RELEASE  07/98   right thumb  . 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 Coronary artery disease Sister   . Breast cancer Sister   . Hypertension Neg Hx   . Diabetes Neg Hx     Social History   Socioeconomic History  . Marital status: Widowed    Spouse name: Not on file  . Number of children: 3  . Years of education: Not on file  . Highest education level: Not on file  Occupational History  . Occupation: Public affairs consultant    Comment: retired 2016  Social Needs  . Financial resource strain: Not on file  . Food insecurity:    Worry: Not on file    Inability: Not on file  . Transportation needs:    Medical: Not on file    Non-medical: Not on file  Tobacco Use  . Smoking status: Never Smoker  . Smokeless tobacco: Never Used  Substance and Sexual Activity  . Alcohol use: No  . Drug use: No  . Sexual activity: Not on file  Lifestyle  . Physical activity:    Days per week: Not on file    Minutes per session: Not on file  . Stress: Not on file  Relationships  . Social connections:    Talks on phone: Not on file    Gets together: Not on file    Attends religious service: Not on file    Active member of club or organization: Not on file  Attends meetings of clubs or organizations: Not on file    Relationship status: Not on file  . Intimate partner violence:    Fear of current or ex partner: Not on file    Emotionally abused: Not on file    Physically abused: Not on file    Forced sexual activity: Not on file  Other Topics Concern  . Not on file  Social History Narrative   Widowed 2011   No living will   Would want daughter Brooke Goodman to make health care decisions for her. Son would be next   Would want attempts at resuscitation   Probably would accept tube feeds   Review of Systems Sleeps well Wears seat belt Teeth are okay---keeps up with dentist Sprained left knee recently--twisted it. No other sig joint issues No heartburn or dysphagia Bowels are fine--no  blood No suspicious skin lesions     Objective:   Physical Exam  Constitutional: She is oriented to person, place, and time. She appears well-developed. No distress.  HENT:  Mouth/Throat: Oropharynx is clear and moist. No oropharyngeal exudate.  Neck: No thyromegaly present.  Cardiovascular: Normal rate, regular rhythm, normal heart sounds and intact distal pulses. Exam reveals no gallop.  No murmur heard. Respiratory: Effort normal and breath sounds normal. No respiratory distress. She has no wheezes. She has no rales.  GI: Soft. There is no tenderness.  Musculoskeletal: She exhibits no edema or tenderness.  Lymphadenopathy:    She has no cervical adenopathy.  Neurological: She is alert and oriented to person, place, and time.  President-- "Dwaine Deter, [place]" [phone] [id number] Recall 3/3 (from last year)  Skin: No rash noted. No erythema.  Psychiatric: She has a normal mood and affect. Her behavior is normal.           Assessment & Plan:

## 2018-08-13 NOTE — Patient Instructions (Signed)
Ask your pharmacist about the shingrix vaccine. 

## 2018-08-13 NOTE — Assessment & Plan Note (Signed)
See social history 

## 2018-08-13 NOTE — Progress Notes (Signed)
Hearing Screening   Method: Audiometry   125Hz  250Hz  500Hz  1000Hz  2000Hz  3000Hz  4000Hz  6000Hz  8000Hz   Right ear:   20 20 20  20     Left ear:   20 20 20  20     Vision Screening Comments: December 2019

## 2018-08-13 NOTE — Assessment & Plan Note (Signed)
I have personally reviewed the Medicare Annual Wellness questionnaire and have noted 1. The patient's medical and social history 2. Their use of alcohol, tobacco or illicit drugs 3. Their current medications and supplements 4. The patient's functional ability including ADL's, fall risks, home safety risks and hearing or visual             impairment. 5. Diet and physical activities 6. Evidence for depression or mood disorders  The patients weight, height, BMI and visual acuity have been recorded in the chart I have made referrals, counseling and provided education to the patient based review of the above and I have provided the pt with a written personalized care plan for preventive services.  I have provided you with a copy of your personalized plan for preventive services. Please take the time to review along with your updated medication list.  Yearly flu vaccine Mammogram in 2 years FIT again Discussed fitness Consider shingrix

## 2018-08-13 NOTE — Assessment & Plan Note (Signed)
Uses pad in bed---okay otherwise

## 2018-08-22 ENCOUNTER — Other Ambulatory Visit (INDEPENDENT_AMBULATORY_CARE_PROVIDER_SITE_OTHER): Payer: Medicare Other

## 2018-08-22 DIAGNOSIS — Z1211 Encounter for screening for malignant neoplasm of colon: Secondary | ICD-10-CM

## 2018-08-22 LAB — FECAL OCCULT BLOOD, IMMUNOCHEMICAL: Fecal Occult Bld: POSITIVE — AB

## 2018-08-26 ENCOUNTER — Other Ambulatory Visit: Payer: Self-pay

## 2018-08-26 DIAGNOSIS — R195 Other fecal abnormalities: Secondary | ICD-10-CM

## 2018-08-27 DIAGNOSIS — H43313 Vitreous membranes and strands, bilateral: Secondary | ICD-10-CM | POA: Diagnosis not present

## 2018-09-15 DIAGNOSIS — R195 Other fecal abnormalities: Secondary | ICD-10-CM | POA: Diagnosis not present

## 2018-11-07 ENCOUNTER — Encounter
Admission: RE | Disposition: A | Discharge status: Home / Self Care | Payer: Self-pay | Source: Home / Self Care | Attending: Gastroenterology

## 2018-11-07 ENCOUNTER — Ambulatory Visit: Payer: Medicare Other | Admitting: Certified Registered"

## 2018-11-07 ENCOUNTER — Encounter: Payer: Self-pay | Admitting: *Deleted

## 2018-11-07 ENCOUNTER — Ambulatory Visit
Admission: RE | Admit: 2018-11-07 | Discharge: 2018-11-07 | Disposition: A | Discharge status: Home / Self Care | Payer: Medicare Other | Attending: Gastroenterology | Admitting: Gastroenterology

## 2018-11-07 DIAGNOSIS — D123 Benign neoplasm of transverse colon: Secondary | ICD-10-CM | POA: Insufficient documentation

## 2018-11-07 DIAGNOSIS — Z6841 Body Mass Index (BMI) 40.0 and over, adult: Secondary | ICD-10-CM | POA: Insufficient documentation

## 2018-11-07 DIAGNOSIS — Z79899 Other long term (current) drug therapy: Secondary | ICD-10-CM | POA: Insufficient documentation

## 2018-11-07 DIAGNOSIS — K635 Polyp of colon: Secondary | ICD-10-CM | POA: Diagnosis not present

## 2018-11-07 DIAGNOSIS — K5730 Diverticulosis of large intestine without perforation or abscess without bleeding: Secondary | ICD-10-CM | POA: Diagnosis not present

## 2018-11-07 DIAGNOSIS — M1990 Unspecified osteoarthritis, unspecified site: Secondary | ICD-10-CM | POA: Diagnosis not present

## 2018-11-07 DIAGNOSIS — D126 Benign neoplasm of colon, unspecified: Secondary | ICD-10-CM | POA: Diagnosis not present

## 2018-11-07 DIAGNOSIS — D122 Benign neoplasm of ascending colon: Secondary | ICD-10-CM | POA: Diagnosis not present

## 2018-11-07 DIAGNOSIS — L309 Dermatitis, unspecified: Secondary | ICD-10-CM | POA: Insufficient documentation

## 2018-11-07 DIAGNOSIS — R32 Unspecified urinary incontinence: Secondary | ICD-10-CM | POA: Diagnosis not present

## 2018-11-07 DIAGNOSIS — K648 Other hemorrhoids: Secondary | ICD-10-CM | POA: Insufficient documentation

## 2018-11-07 DIAGNOSIS — K921 Melena: Secondary | ICD-10-CM | POA: Insufficient documentation

## 2018-11-07 DIAGNOSIS — K5790 Diverticulosis of intestine, part unspecified, without perforation or abscess without bleeding: Secondary | ICD-10-CM | POA: Diagnosis not present

## 2018-11-07 DIAGNOSIS — I10 Essential (primary) hypertension: Secondary | ICD-10-CM | POA: Diagnosis not present

## 2018-11-07 DIAGNOSIS — G2581 Restless legs syndrome: Secondary | ICD-10-CM | POA: Insufficient documentation

## 2018-11-07 DIAGNOSIS — D120 Benign neoplasm of cecum: Secondary | ICD-10-CM | POA: Insufficient documentation

## 2018-11-07 DIAGNOSIS — L409 Psoriasis, unspecified: Secondary | ICD-10-CM | POA: Insufficient documentation

## 2018-11-07 DIAGNOSIS — R195 Other fecal abnormalities: Secondary | ICD-10-CM | POA: Diagnosis not present

## 2018-11-07 HISTORY — PX: COLONOSCOPY WITH PROPOFOL: SHX5780

## 2018-11-07 SURGERY — COLONOSCOPY WITH PROPOFOL
Anesthesia: General

## 2018-11-07 MED ORDER — SODIUM CHLORIDE 0.9 % IV SOLN
INTRAVENOUS | Status: DC
  Administered 2018-11-07: 14:00:00 via INTRAVENOUS

## 2018-11-07 MED ORDER — PROPOFOL 500 MG/50ML IV EMUL
INTRAVENOUS | Status: AC
  Filled 2018-11-07: qty 50

## 2018-11-07 MED ORDER — PROPOFOL 10 MG/ML IV BOLUS
INTRAVENOUS | Status: AC
  Filled 2018-11-07: qty 20

## 2018-11-07 MED ORDER — SODIUM CHLORIDE 0.9 % IV SOLN
INTRAVENOUS | Status: DC
  Administered 2018-11-07: 1000 mL via INTRAVENOUS

## 2018-11-07 MED ORDER — PROPOFOL 10 MG/ML IV BOLUS
INTRAVENOUS | Status: DC | PRN
  Administered 2018-11-07: 40 mg via INTRAVENOUS

## 2018-11-07 MED ORDER — PROPOFOL 500 MG/50ML IV EMUL
INTRAVENOUS | Status: DC | PRN
  Administered 2018-11-07: 150 ug/kg/min via INTRAVENOUS

## 2018-11-07 NOTE — H&P (Signed)
Outpatient short stay form Pre-procedure 11/07/2018 1:33 PM Lollie Sails MD  Primary Physician: Viviana Simpler MD  Reason for visit: Colonoscopy  History of present illness: Patient is a 78 year old female presenting today for her colonoscopy.  She is never had a colonoscopy in the past.  There is no family history of colon cancer or colon polyps.  However she has had a positive Hemoccult test.  Patient tolerated prep well.  She takes no aspirin or blood thinner.    Current Facility-Administered Medications:  .  0.9 %  sodium chloride infusion, , Intravenous, Continuous, Lollie Sails, MD .  0.9 %  sodium chloride infusion, , Intravenous, Continuous, Lollie Sails, MD  Medications Prior to Admission  Medication Sig Dispense Refill Last Dose  . triamterene-hydrochlorothiazide (MAXZIDE-25) 37.5-25 MG tablet TAKE 1 TABLET BY MOUTH  DAILY 90 tablet 0 11/07/2018 at Unknown time  . Cholecalciferol (VITAMIN D3) 1000 units CAPS Take by mouth.   Taking  . clobetasol cream (TEMOVATE) 7.00 % Apply 1 application topically 2 (two) times daily as needed. 60 g 3 Taking  . Multiple Vitamin (MULTIVITAMIN) tablet Take 1 tablet by mouth daily.   Taking  . Probiotic Product (PROBIOTIC DAILY PO) Take 1 tablet by mouth 2 (two) times daily.   Taking     No Known Allergies   Past Medical History:  Diagnosis Date  . Diverticulitis   . Hypertension   . Osteoarthritis   . Psoriasis   . Restless leg syndrome   . Urinary incontinence     Review of systems:      Physical Exam    Heart and lungs: Regular rate and rhythm without rub or gallop, lungs are bilaterally clear.    HEENT: Normocephalic atraumatic eyes are anicteric    Other:    Pertinant exam for procedure: Soft nontender nondistended bowel sounds positive normoactive.    Planned proceedures: Colonoscopy and indicated procedures.  I have discussed the risks benefits and complications of procedures to include not limited  to bleeding, infection, perforation and the risk of sedation and the patient wishes to proceed.   Lollie Sails, MD Gastroenterology 11/07/2018  1:33 PM

## 2018-11-07 NOTE — Op Note (Signed)
[facility] Gastroenterology Patient Name: Brooke Goodman Procedure Date: 11/07/2018 2:04 PM MRN: [id number] Account #: [id number] Date of Birth: 01-22-1941 Admit Type: Outpatient Age: 78 Room: [facility] ENDO ROOM 3 Gender: Female Note Status: Finalized Procedure:            Colonoscopy Indications:          Heme positive stool Providers:            Lollie Sails, MD Referring MD:         Venia Carbon (Referring MD) Medicines:            Monitored Anesthesia Care Complications:        No immediate complications. Procedure:            Pre-Anesthesia Assessment:                       - ASA Grade Assessment: III - A patient with severe                        systemic disease.                       After obtaining informed consent, the colonoscope was                        passed under direct vision. Throughout the procedure,                        the patient's blood pressure, pulse, and oxygen                        saturations were monitored continuously. The                        Colonoscope was introduced through the anus and                        advanced to the the cecum, identified by appendiceal                        orifice and ileocecal valve. The colonoscopy was                        performed with moderate difficulty due to significant                        looping. Successful completion of the procedure was                        aided by changing the patient to a supine position and                        changing the patient to a prone position. The patient                        tolerated the procedure well. The quality of the bowel                        preparation was good. Findings:      Multiple small to medium-mouthed diverticula were found in the sigmoid  colon and descending colon.      Two sessile polyps were found in the hepatic flexure. The polyps were 2       to 3 mm in size. These polyps were removed with a cold biopsy  forceps.       Resection and retrieval were complete.      A 3 mm polyp was found in the cecum. The polyp was sessile. The polyp       was removed with a cold biopsy forceps. Resection and retrieval were       complete.      A 12 mm polyp was found in the proximal ascending colon. The polyp was       sessile. Polypectomy was attempted, initially using a lift and cut       technique with a cold snare. Polyp resection was incomplete with this       device. This intervention then required a different device and       polypectomy technique. The polyp was removed with a cold biopsy forceps.       Resection and retrieval were complete.      A 8 mm polyp was found in the distal ascending colon. The polyp was       sessile. The polyp was removed with a cold snare. Resection and       retrieval were complete.      A 3 mm polyp was found in the mid transverse colon. The polyp was       sessile. The polyp was removed with a cold biopsy forceps. Resection and       retrieval were complete.      Non-bleeding internal hemorrhoids were found during anoscopy. The       hemorrhoids were small.      The digital rectal exam findings include perianal dermatitis. Impression:           - Diverticulosis in the sigmoid colon and in the                        descending colon.                       - Two 2 to 3 mm polyps at the hepatic flexure, removed                        with a cold biopsy forceps. Resected and retrieved.                       - One 3 mm polyp in the cecum, removed with a cold                        biopsy forceps. Resected and retrieved.                       - One 12 mm polyp in the proximal ascending colon,                        removed with a cold biopsy forceps. Resected and                        retrieved.                       -  One 8 mm polyp in the distal ascending colon, removed                        with a cold snare. Resected and retrieved.                       - One 3 mm  polyp in the mid transverse colon, removed                        with a cold biopsy forceps. Resected and retrieved.                       - Non-bleeding internal hemorrhoids.                       - Perianal dermatitis found on digital rectal exam. Recommendation:       - Discharge patient to home.                       - Telephone GI clinic for pathology results in 5 days. Procedure Code(s):    --- Professional ---                       [phone], Colonoscopy, flexible; with removal of tumor(s),                        polyp(s), or other lesion(s) by snare technique                       [id number], 74, Colonoscopy, flexible; with biopsy, single                        or multiple Diagnosis Code(s):    --- Professional ---                       K64.8, Other hemorrhoids                       D12.3, Benign neoplasm of transverse colon (hepatic                        flexure or splenic flexure)                       D12.0, Benign neoplasm of cecum                       D12.2, Benign neoplasm of ascending colon                       R19.5, Other fecal abnormalities                       K57.30, Diverticulosis of large intestine without                        perforation or abscess without bleeding CPT copyright 2018 American Medical Association. All rights reserved. The codes documented in this report are preliminary and upon coder review may  be revised to meet current compliance requirements. Lollie Sails, MD 11/07/2018 3:08:42 PM This report has been signed electronically. Number of Addenda: 0 Note  Initiated On: 11/07/2018 2:04 PM Scope Withdrawal Time: 0 hours 22 minutes 14 seconds  Total Procedure Duration: 0 hours 48 minutes 29 seconds       [facility]

## 2018-11-07 NOTE — Transfer of Care (Signed)
Immediate Anesthesia Transfer of Care Note  Patient: Brooke Goodman  Procedure(s) Performed: COLONOSCOPY WITH PROPOFOL (N/A )  Patient Location: Endoscopy Unit  Anesthesia Type:General  Level of Consciousness: awake and alert   Airway & Oxygen Therapy: Patient Spontanous Breathing and Patient connected to nasal cannula oxygen  Post-op Assessment: Report given to RN and Post -op Vital signs reviewed and stable  Post vital signs: Reviewed and stable  Last Vitals:  Vitals Value Taken Time  BP    Temp    Pulse    Resp    SpO2      Last Pain:  Vitals:   11/07/18 1336  TempSrc: Tympanic  PainSc: 0-No pain         Complications: No apparent anesthesia complications

## 2018-11-07 NOTE — Anesthesia Post-op Follow-up Note (Signed)
Anesthesia QCDR form completed.        

## 2018-11-07 NOTE — Anesthesia Preprocedure Evaluation (Signed)
Anesthesia Evaluation  Patient identified by MRN, date of birth, ID band Patient awake    Reviewed: Allergy & Precautions, H&P , NPO status , Patient's Chart, lab work & pertinent test results, reviewed documented beta blocker date and time   History of Anesthesia Complications Negative for: history of anesthetic complications  Airway Mallampati: II  TM Distance: >3 FB Neck ROM: full    Dental no notable dental hx. (+) Edentulous Upper, Edentulous Lower, Upper Dentures, Lower Dentures, Dental Advidsory Given   Pulmonary neg pulmonary ROS,           Cardiovascular Exercise Tolerance: Good hypertension, (-) angina(-) CAD, (-) Past MI and (-) CABG (-) dysrhythmias      Neuro/Psych negative neurological ROS  negative psych ROS   GI/Hepatic negative GI ROS, Neg liver ROS,   Endo/Other  neg diabetesMorbid obesity  Renal/GU negative Renal ROS  negative genitourinary   Musculoskeletal   Abdominal   Peds  Hematology negative hematology ROS (+)   Anesthesia Other Findings Past Medical History:   Diverticulitis                                               Hypertension                                                 Psoriasis                                                    Urinary incontinence                                         Osteoarthritis                                               Restless leg syndrome                                        Reproductive/Obstetrics negative OB ROS                             Anesthesia Physical  Anesthesia Plan  ASA: III  Anesthesia Plan: General   Post-op Pain Management:    Induction: Intravenous  PONV Risk Score and Plan: 3 and Propofol infusion and TIVA  Airway Management Planned: Natural Airway and Nasal Cannula  Additional Equipment:   Intra-op Plan:   Post-operative Plan:   Informed Consent: I have reviewed the patients  History and Physical, chart, labs and discussed the procedure including the risks, benefits and alternatives for the proposed anesthesia with the patient or authorized representative who has indicated his/her understanding and acceptance.     Dental Advisory Given  Plan Discussed with: Anesthesiologist, CRNA and  Surgeon  Anesthesia Plan Comments:         Anesthesia Quick Evaluation

## 2018-11-09 NOTE — Anesthesia Postprocedure Evaluation (Signed)
Anesthesia Post Note  Patient: Brooke Goodman  Procedure(s) Performed: COLONOSCOPY WITH PROPOFOL (N/A )  Patient location during evaluation: Endoscopy Anesthesia Type: General Level of consciousness: awake and alert Pain management: pain level controlled Vital Signs Assessment: post-procedure vital signs reviewed and stable Respiratory status: spontaneous breathing, nonlabored ventilation, respiratory function stable and patient connected to nasal cannula oxygen Cardiovascular status: blood pressure returned to baseline and stable Postop Assessment: no apparent nausea or vomiting Anesthetic complications: no     Last Vitals:  Vitals:   11/07/18 1521 11/07/18 1531  BP: 125/63 131/62  Pulse: 63 66  Resp: (!) 23 (!) 21  Temp: (!) 36.3 C   SpO2: 99% 99%    Last Pain:  Vitals:   11/08/18 1726  TempSrc:   PainSc: 0-No pain                 Precious Haws Nollie Shiflett

## 2018-11-11 LAB — SURGICAL PATHOLOGY

## 2018-12-08 ENCOUNTER — Other Ambulatory Visit: Payer: Self-pay | Admitting: Internal Medicine

## 2019-04-30 DIAGNOSIS — Z96651 Presence of right artificial knee joint: Secondary | ICD-10-CM | POA: Diagnosis not present

## 2019-04-30 DIAGNOSIS — M1711 Unilateral primary osteoarthritis, right knee: Secondary | ICD-10-CM | POA: Diagnosis not present

## 2019-05-11 ENCOUNTER — Telehealth: Payer: Self-pay | Admitting: Internal Medicine

## 2019-05-11 NOTE — Telephone Encounter (Signed)
Patient called requesting a rx be called in to her pharmacy for Clobetasolpropionate Cream.  It's for her psoriasis.  Patient didn't have a refill.  Patient uses [facility]..

## 2019-05-12 MED ORDER — CLOBETASOL PROPIONATE 0.05 % EX CREA: 1.0000 "application " | TOPICAL_CREAM | Freq: Two times a day (BID) | CUTANEOUS | 3 refills | Status: DC | PRN

## 2019-05-12 NOTE — Telephone Encounter (Signed)
Rx sent electronically.  

## 2019-08-19 ENCOUNTER — Other Ambulatory Visit: Payer: Self-pay

## 2019-08-19 ENCOUNTER — Encounter: Payer: Self-pay | Admitting: Internal Medicine

## 2019-08-19 ENCOUNTER — Ambulatory Visit (INDEPENDENT_AMBULATORY_CARE_PROVIDER_SITE_OTHER): Payer: Medicare Other | Admitting: Internal Medicine

## 2019-08-19 VITALS — BP 138/80 | HR 66 | Temp 97.3°F | Ht 61.5 in | Wt 239.0 lb

## 2019-08-19 DIAGNOSIS — M159 Polyosteoarthritis, unspecified: Secondary | ICD-10-CM

## 2019-08-19 DIAGNOSIS — Z0000 Encounter for general adult medical examination without abnormal findings: Secondary | ICD-10-CM

## 2019-08-19 DIAGNOSIS — L409 Psoriasis, unspecified: Secondary | ICD-10-CM | POA: Diagnosis not present

## 2019-08-19 DIAGNOSIS — I10 Essential (primary) hypertension: Secondary | ICD-10-CM

## 2019-08-19 DIAGNOSIS — N1831 Chronic kidney disease, stage 3a: Secondary | ICD-10-CM | POA: Diagnosis not present

## 2019-08-19 DIAGNOSIS — Z7189 Other specified counseling: Secondary | ICD-10-CM

## 2019-08-19 LAB — RENAL FUNCTION PANEL
Albumin: 4.3 g/dL (ref 3.5–5.2)
BUN: 23 mg/dL (ref 6–23)
CO2: 30 mEq/L (ref 19–32)
Calcium: 10.4 mg/dL (ref 8.4–10.5)
Chloride: 100 mEq/L (ref 96–112)
Creatinine, Ser: 0.99 mg/dL (ref 0.40–1.20)
GFR: 54.13 mL/min — ABNORMAL LOW (ref >60.00)
Glucose, Bld: 116 mg/dL — ABNORMAL HIGH (ref 70–99)
Phosphorus: 3.6 mg/dL (ref 2.3–4.6)
Potassium: 4.2 mEq/L (ref 3.5–5.1)
Sodium: 139 mEq/L (ref 135–145)

## 2019-08-19 LAB — CBC
HCT: 42.5 % (ref 36.0–46.0)
Hemoglobin: 14 g/dL (ref 12.0–15.0)
MCHC: 32.9 g/dL (ref 30.0–36.0)
MCV: 91.5 fl (ref 78.0–100.0)
Platelets: 301 10*3/uL (ref 150.0–400.0)
RBC: 4.65 Mil/uL (ref 3.87–5.11)
RDW: 13.3 % (ref 11.5–15.5)
WBC: 9.3 10*3/uL (ref 4.0–10.5)

## 2019-08-19 LAB — HEPATIC FUNCTION PANEL
ALT: 16 U/L (ref 0–35)
AST: 25 U/L (ref 0–37)
Albumin: 4.3 g/dL (ref 3.5–5.2)
Alkaline Phosphatase: 80 U/L (ref 39–117)
Bilirubin, Direct: 0.1 mg/dL (ref 0.0–0.3)
Total Bilirubin: 0.5 mg/dL (ref 0.2–1.2)
Total Protein: 7.7 g/dL (ref 6.0–8.3)

## 2019-08-19 NOTE — Assessment & Plan Note (Signed)
I have personally reviewed the Medicare Annual Wellness questionnaire and have noted  1. The patient's medical and social history  2. Their use of alcohol, tobacco or illicit drugs  3. Their current medications and supplements  4. The patient's functional ability including ADL's, fall risks, home safety risks and hearing or visual              impairment.  5. Diet and physical activities  6. Evidence for depression or mood disorders  The patients weight, height, BMI and visual acuity have been recorded in the chart  I have made referrals, counseling and provided education to the patient based review of the above and I have provided the pt with a written personalized care plan for preventive services.   I have provided you with a copy of your personalized plan for preventive services. Please take the time to review along with your updated medication list.  Recent colon--multiple polyps. Probably done with this due to age Last mammogram next year Discussed fitness Flu vaccine Had shingrix

## 2019-08-19 NOTE — Assessment & Plan Note (Signed)
Uses topical only

## 2019-08-19 NOTE — Assessment & Plan Note (Signed)
See social history 

## 2019-08-19 NOTE — Progress Notes (Signed)
Hearing Screening   Method: Audiometry   125Hz  250Hz  500Hz  1000Hz  2000Hz  3000Hz  4000Hz  6000Hz  8000Hz   Right ear:   20 20 20  20     Left ear:   20 20 20  20     Vision Screening Comments: January 2020

## 2019-08-19 NOTE — Assessment & Plan Note (Signed)
Discussed limiting ibuprofen Tylenol okay

## 2019-08-19 NOTE — Assessment & Plan Note (Signed)
Will monitor

## 2019-08-19 NOTE — Assessment & Plan Note (Signed)
Discussed modest goal of losing 5-10# per year DASH info given

## 2019-08-19 NOTE — Progress Notes (Signed)
Subjective:    Patient ID: Brooke Goodman, female    DOB: June 17, 1941, 78 y.o.   MRN: [id number]  HPI Here for Medicare wellness visit and follow up of chronic health conditions  This visit occurred during the SARS-CoV-2 public health emergency.  Safety protocols were in place, including screening questions prior to the visit, additional usage of staff PPE, and extensive cleaning of exam room while observing appropriate contact time as indicated for disinfecting solutions.   Reviewed form and advanced directives Reviewed other doctors No alcohol or tobacco Has started doing some walking--mostly in yard Vision is okay---early cataract on left Hearing is fine No sig depression or anhedonia Independent with instrumental ADLs No sig memory problems  Surviving COVID Lives alone Does some basic shopping and daughter will bring some things Various family members have had COVID  Has lost some weight before COVID Now about the same as last year Discussed improving eating  Checks BP at times---usually okay (106/66) No dizziness or syncope No chest pain or SOB No SOB No palpitations --other than with bad news or something  Still mild urge incontinence--especially after getting up Wears pad No dysuria or hematuria  Still with various joint pains Worse in cold Uses advil infrequently  Reviewed known GFR in high 50's----limit NSAID or try tylenol  Continues to use creams for the psoriasis Flared some with stress  Current Outpatient Medications on File Prior to Visit  Medication Sig Dispense Refill  . Cholecalciferol (VITAMIN D3) 1000 units CAPS Take by mouth.    . clobetasol cream (TEMOVATE) [id number] % Apply 1 application topically 2 (two) times daily as needed. 60 g 3  . Multiple Vitamin (MULTIVITAMIN) tablet Take 1 tablet by mouth daily.    . Probiotic Product (PROBIOTIC DAILY PO) Take 1 tablet by mouth 2 (two) times daily.    Marland Kitchen triamterene-hydrochlorothiazide (MAXZIDE-25)  37.5-25 MG tablet TAKE 1 TABLET BY MOUTH  DAILY 90 tablet 3   No current facility-administered medications on file prior to visit.     No Known Allergies  Past Medical History:  Diagnosis Date  . Diverticulitis   . Hypertension   . Osteoarthritis   . Psoriasis   . Restless leg syndrome   . Urinary incontinence     Past Surgical History:  Procedure Laterality Date  . COLONOSCOPY WITH PROPOFOL N/A 11/07/2018   Procedure: COLONOSCOPY WITH PROPOFOL;  Surgeon: Lollie Sails, MD;  Location: [facility] ENDOSCOPY;  Service: Endoscopy;  Laterality: N/A;  . TENDON RELEASE  07/98   right thumb  . 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 Coronary artery disease Sister   . Breast cancer Sister   . Hypertension Neg Hx   . Diabetes Neg Hx     Social History   Socioeconomic History  . Marital status: Widowed    Spouse name: Not on file  . Number of children: 3  . Years of education: Not on file  . Highest education level: Not on file  Occupational History  . Occupation: Public affairs consultant    Comment: retired 2016  Social Needs  . Financial resource strain: Not on file  . Food insecurity    Worry: Not on file    Inability: Not on file  . Transportation needs  Medical: Not on file    Non-medical: Not on file  Tobacco Use  . Smoking status: Never Smoker  . Smokeless tobacco: Never Used  Substance and Sexual Activity  . Alcohol use: No  . Drug use: No  . Sexual activity: Not on file  Lifestyle  . Physical activity    Days per week: Not on file    Minutes per session: Not on file  . Stress: Not on file  Relationships  . Social Herbalist on phone: Not on file    Gets together: Not on file    Attends religious service: Not on file    Active member of  club or organization: Not on file    Attends meetings of clubs or organizations: Not on file    Relationship status: Not on file  . Intimate partner violence    Fear of current or ex partner: Not on file    Emotionally abused: Not on file    Physically abused: Not on file    Forced sexual activity: Not on file  Other Topics Concern  . Not on file  Social History Narrative   Widowed 2011   No living will--has it ready though   Would want daughter [name] to make health care decisions for her. Son would be next   Would want attempts at resuscitation   Probably would accept tube feeds   Review of Systems Appetite is fine Weight ~ stable No headaches Sleeps okay--nocturia x 1 Bowels are fine. No blood Rare heartburn. No dysphagia No suspicious skin lesions.  Teeth are fine--partial on bottom    Objective:   Physical Exam  Constitutional: She is oriented to person, place, and time.  HENT:  Mouth/Throat: Oropharynx is clear and moist. No oropharyngeal exudate.  Full upper, partial lower  Neck: No thyromegaly present.  Cardiovascular: Normal rate, regular rhythm, normal heart sounds and intact distal pulses. Exam reveals no gallop.  No murmur heard. Respiratory: Effort normal and breath sounds normal. No respiratory distress. She has no wheezes. She has no rales.  GI: Soft. There is no abdominal tenderness.  Musculoskeletal:        General: No tenderness or edema.  Lymphadenopathy:    She has no cervical adenopathy.  Neurological: She is alert and oriented to person, place, and time.  President--- "Daisy Floro, Obama, Bush" [phone] D-[id number] Recall 3/3 (from last year!)  Skin:  Scattered mild psoriatic plaques  Psychiatric: She has a normal mood and affect. Her behavior is normal.           Assessment & Plan:

## 2019-08-19 NOTE — Assessment & Plan Note (Signed)
BP Readings from Last 3 Encounters:  08/19/19 138/80  11/07/18 131/62  08/13/18 120/76   Good control Will check labs

## 2019-08-19 NOTE — Patient Instructions (Signed)

## 2019-11-10 ENCOUNTER — Other Ambulatory Visit: Payer: Self-pay | Admitting: Internal Medicine

## 2020-01-08 ENCOUNTER — Telehealth: Payer: Self-pay | Admitting: Internal Medicine

## 2020-01-08 NOTE — Telephone Encounter (Signed)
Form mailed to [facility].

## 2020-01-08 NOTE — Telephone Encounter (Signed)
Patient came by office and dropped off handicapped placard form to be signed by Dr.Letvak.  Patient left stamped envelope and check, so form can be mailed directly to [facility].

## 2020-01-08 NOTE — Telephone Encounter (Signed)
Form done No charge 

## 2020-05-05 DIAGNOSIS — Z96651 Presence of right artificial knee joint: Secondary | ICD-10-CM | POA: Diagnosis not present

## 2020-05-05 DIAGNOSIS — M1711 Unilateral primary osteoarthritis, right knee: Secondary | ICD-10-CM | POA: Diagnosis not present

## 2020-08-22 ENCOUNTER — Ambulatory Visit: Payer: Medicare Other | Admitting: Internal Medicine

## 2020-08-23 ENCOUNTER — Ambulatory Visit (INDEPENDENT_AMBULATORY_CARE_PROVIDER_SITE_OTHER): Payer: Medicare Other | Admitting: Internal Medicine

## 2020-08-23 ENCOUNTER — Other Ambulatory Visit: Payer: Self-pay

## 2020-08-23 ENCOUNTER — Encounter: Payer: Self-pay | Admitting: Internal Medicine

## 2020-08-23 VITALS — BP 120/84 | HR 75 | Temp 97.3°F | Ht 61.0 in | Wt 239.0 lb

## 2020-08-23 DIAGNOSIS — I10 Essential (primary) hypertension: Secondary | ICD-10-CM | POA: Diagnosis not present

## 2020-08-23 DIAGNOSIS — M159 Polyosteoarthritis, unspecified: Secondary | ICD-10-CM

## 2020-08-23 DIAGNOSIS — Z0000 Encounter for general adult medical examination without abnormal findings: Secondary | ICD-10-CM

## 2020-08-23 DIAGNOSIS — Z23 Encounter for immunization: Secondary | ICD-10-CM

## 2020-08-23 DIAGNOSIS — R7301 Impaired fasting glucose: Secondary | ICD-10-CM

## 2020-08-23 DIAGNOSIS — Z7189 Other specified counseling: Secondary | ICD-10-CM

## 2020-08-23 DIAGNOSIS — N1831 Chronic kidney disease, stage 3a: Secondary | ICD-10-CM | POA: Diagnosis not present

## 2020-08-23 DIAGNOSIS — L409 Psoriasis, unspecified: Secondary | ICD-10-CM

## 2020-08-23 DIAGNOSIS — N3941 Urge incontinence: Secondary | ICD-10-CM

## 2020-08-23 LAB — HEPATIC FUNCTION PANEL
ALT: 15 U/L (ref 0–35)
AST: 26 U/L (ref 0–37)
Albumin: 4.3 g/dL (ref 3.5–5.2)
Alkaline Phosphatase: 82 U/L (ref 39–117)
Bilirubin, Direct: 0.1 mg/dL (ref 0.0–0.3)
Total Bilirubin: 0.5 mg/dL (ref 0.2–1.2)
Total Protein: 7.2 g/dL (ref 6.0–8.3)

## 2020-08-23 LAB — T4, FREE: Free T4: 0.91 ng/dL (ref 0.60–1.60)

## 2020-08-23 LAB — CBC
HCT: 41.8 % (ref 36.0–46.0)
Hemoglobin: 13.8 g/dL (ref 12.0–15.0)
MCHC: 32.9 g/dL (ref 30.0–36.0)
MCV: 91.4 fl (ref 78.0–100.0)
Platelets: 285 10*3/uL (ref 150.0–400.0)
RBC: 4.57 Mil/uL (ref 3.87–5.11)
RDW: 13.2 % (ref 11.5–15.5)
WBC: 10 10*3/uL (ref 4.0–10.5)

## 2020-08-23 LAB — RENAL FUNCTION PANEL
Albumin: 4.3 g/dL (ref 3.5–5.2)
BUN: 34 mg/dL — ABNORMAL HIGH (ref 6–23)
CO2: 29 mEq/L (ref 19–32)
Calcium: 9.7 mg/dL (ref 8.4–10.5)
Chloride: 99 mEq/L (ref 96–112)
Creatinine, Ser: 1.08 mg/dL (ref 0.40–1.20)
GFR: 48.74 mL/min — ABNORMAL LOW (ref >60.00)
Glucose, Bld: 94 mg/dL (ref 70–99)
Phosphorus: 3.8 mg/dL (ref 2.3–4.6)
Potassium: 4.3 mEq/L (ref 3.5–5.1)
Sodium: 140 mEq/L (ref 135–145)

## 2020-08-23 LAB — LIPID PANEL
Cholesterol: 211 mg/dL — ABNORMAL HIGH (ref 0–200)
HDL: 89.2 mg/dL (ref >39.00)
LDL Cholesterol: 109 mg/dL — ABNORMAL HIGH (ref 0–99)
NonHDL: 122.2
Total CHOL/HDL Ratio: 2
Triglycerides: 64 mg/dL (ref 0.0–149.0)
VLDL: 12.8 mg/dL (ref 0.0–40.0)

## 2020-08-23 LAB — HEMOGLOBIN A1C: Hgb A1c MFr Bld: 5.9 % (ref 4.6–6.5)

## 2020-08-23 NOTE — Assessment & Plan Note (Signed)
See social history 

## 2020-08-23 NOTE — Assessment & Plan Note (Signed)
Will check labs Urged her to work towards weight loss

## 2020-08-23 NOTE — Assessment & Plan Note (Signed)
BP Readings from Last 3 Encounters:  08/23/20 120/84  08/19/19 138/80  11/07/18 131/62   Good control on triamterene/HCTZ Will check labs

## 2020-08-23 NOTE — Patient Instructions (Signed)
DASH Eating Plan DASH stands for "Dietary Approaches to Stop Hypertension." The DASH eating plan is a healthy eating plan that has been shown to reduce high blood pressure (hypertension). It may also reduce your risk for type 2 diabetes, heart disease, and stroke. The DASH eating plan may also help with weight loss. What are tips for following this plan?  General guidelines  Avoid eating more than 2,300 mg (milligrams) of salt (sodium) a day. If you have hypertension, you may need to reduce your sodium intake to 1,500 mg a day.  Limit alcohol intake to no more than 1 drink a day for nonpregnant women and 2 drinks a day for men. One drink equals 12 oz of beer, 5 oz of wine, or 1 oz of hard liquor.  Work with your health care provider to maintain a healthy body weight or to lose weight. Ask what an ideal weight is for you.  Get at least 30 minutes of exercise that causes your heart to beat faster (aerobic exercise) most days of the week. Activities may include walking, swimming, or biking.  Work with your health care provider or diet and nutrition specialist (dietitian) to adjust your eating plan to your individual calorie needs. Reading food labels   Check food labels for the amount of sodium per serving. Choose foods with less than 5 percent of the Daily Value of sodium. Generally, foods with less than 300 mg of sodium per serving fit into this eating plan.  To find whole grains, look for the word "whole" as the first word in the ingredient list. Shopping  Buy products labeled as "low-sodium" or "no salt added."  Buy fresh foods. Avoid canned foods and premade or frozen meals. Cooking  Avoid adding salt when cooking. Use salt-free seasonings or herbs instead of table salt or sea salt. Check with your health care provider or pharmacist before using salt substitutes.  Do not fry foods. Cook foods using healthy methods such as baking, boiling, grilling, and broiling instead.  Cook with  heart-healthy oils, such as olive, canola, soybean, or sunflower oil. Meal planning  Eat a balanced diet that includes: ? 5 or more servings of fruits and vegetables each day. At each meal, try to fill half of your plate with fruits and vegetables. ? Up to 6-8 servings of whole grains each day. ? Less than 6 oz of lean meat, poultry, or fish each day. A 3-oz serving of meat is about the same size as a deck of cards. One egg equals 1 oz. ? 2 servings of low-fat dairy each day. ? A serving of nuts, seeds, or beans 5 times each week. ? Heart-healthy fats. Healthy fats called Omega-3 fatty acids are found in foods such as flaxseeds and coldwater fish, like sardines, salmon, and mackerel.  Limit how much you eat of the following: ? Canned or prepackaged foods. ? Food that is high in trans fat, such as fried foods. ? Food that is high in saturated fat, such as fatty meat. ? Sweets, desserts, sugary drinks, and other foods with added sugar. ? Full-fat dairy products.  Do not salt foods before eating.  Try to eat at least 2 vegetarian meals each week.  Eat more home-cooked food and less restaurant, buffet, and fast food.  When eating at a restaurant, ask that your food be prepared with less salt or no salt, if possible. What foods are recommended? The items listed may not be a complete list. Talk with your dietitian about   what dietary choices are best for you. Grains Whole-grain or whole-wheat bread. Whole-grain or whole-wheat pasta. Brown rice. Oatmeal. Quinoa. Bulgur. Whole-grain and low-sodium cereals. Pita bread. Low-fat, low-sodium crackers. Whole-wheat flour tortillas. Vegetables Fresh or frozen vegetables (raw, steamed, roasted, or grilled). Low-sodium or reduced-sodium tomato and vegetable juice. Low-sodium or reduced-sodium tomato sauce and tomato paste. Low-sodium or reduced-sodium canned vegetables. Fruits All fresh, dried, or frozen fruit. Canned fruit in natural juice (without  added sugar). Meat and other protein foods Skinless chicken or turkey. Ground chicken or turkey. Pork with fat trimmed off. Fish and seafood. Egg whites. Dried beans, peas, or lentils. Unsalted nuts, nut butters, and seeds. Unsalted canned beans. Lean cuts of beef with fat trimmed off. Low-sodium, lean deli meat. Dairy Low-fat (1%) or fat-free (skim) milk. Fat-free, low-fat, or reduced-fat cheeses. Nonfat, low-sodium ricotta or cottage cheese. Low-fat or nonfat yogurt. Low-fat, low-sodium cheese. Fats and oils Soft margarine without trans fats. Vegetable oil. Low-fat, reduced-fat, or light mayonnaise and salad dressings (reduced-sodium). Canola, safflower, olive, soybean, and sunflower oils. Avocado. Seasoning and other foods Herbs. Spices. Seasoning mixes without salt. Unsalted popcorn and pretzels. Fat-free sweets. What foods are not recommended? The items listed may not be a complete list. Talk with your dietitian about what dietary choices are best for you. Grains Baked goods made with fat, such as croissants, muffins, or some breads. Dry pasta or rice meal packs. Vegetables Creamed or fried vegetables. Vegetables in a cheese sauce. Regular canned vegetables (not low-sodium or reduced-sodium). Regular canned tomato sauce and paste (not low-sodium or reduced-sodium). Regular tomato and vegetable juice (not low-sodium or reduced-sodium). Pickles. Olives. Fruits Canned fruit in a light or heavy syrup. Fried fruit. Fruit in cream or butter sauce. Meat and other protein foods Fatty cuts of meat. Ribs. Fried meat. Bacon. Sausage. Bologna and other processed lunch meats. Salami. Fatback. Hotdogs. Bratwurst. Salted nuts and seeds. Canned beans with added salt. Canned or smoked fish. Whole eggs or egg yolks. Chicken or turkey with skin. Dairy Whole or 2% milk, cream, and half-and-half. Whole or full-fat cream cheese. Whole-fat or sweetened yogurt. Full-fat cheese. Nondairy creamers. Whipped toppings.  Processed cheese and cheese spreads. Fats and oils Butter. Stick margarine. Lard. Shortening. Ghee. Bacon fat. Tropical oils, such as coconut, palm kernel, or palm oil. Seasoning and other foods Salted popcorn and pretzels. Onion salt, garlic salt, seasoned salt, table salt, and sea salt. Worcestershire sauce. Tartar sauce. Barbecue sauce. Teriyaki sauce. Soy sauce, including reduced-sodium. Steak sauce. Canned and packaged gravies. Fish sauce. Oyster sauce. Cocktail sauce. Horseradish that you find on the shelf. Ketchup. Mustard. Meat flavorings and tenderizers. Bouillon cubes. Hot sauce and Tabasco sauce. Premade or packaged marinades. Premade or packaged taco seasonings. Relishes. Regular salad dressings. Where to find more information:  National Heart, Lung, and Blood Institute: www.nhlbi.nih.gov  American Heart Association: www.heart.org Summary  The DASH eating plan is a healthy eating plan that has been shown to reduce high blood pressure (hypertension). It may also reduce your risk for type 2 diabetes, heart disease, and stroke.  With the DASH eating plan, you should limit salt (sodium) intake to 2,300 mg a day. If you have hypertension, you may need to reduce your sodium intake to 1,500 mg a day.  When on the DASH eating plan, aim to eat more fresh fruits and vegetables, whole grains, lean proteins, low-fat dairy, and heart-healthy fats.  Work with your health care provider or diet and nutrition specialist (dietitian) to adjust your eating plan to your   individual calorie needs. This information is not intended to replace advice given to you by your health care provider. Make sure you discuss any questions you have with your health care provider. Document Revised: 08/09/2017 Document Reviewed: 08/20/2016 Elsevier Patient Education  2020 Elsevier Inc.  

## 2020-08-23 NOTE — Progress Notes (Signed)
Hearing Screening   125Hz  250Hz  500Hz  1000Hz  2000Hz  3000Hz  4000Hz  6000Hz  8000Hz   Right ear:   20 20 20  20     Left ear:   20 20 20  20     Vision Screening Comments: Has upcoming appt

## 2020-08-23 NOTE — Assessment & Plan Note (Signed)
Healthy but obese Discussed exercise--aerobic and resistance Yearly flu vaccine Done with mammograms Polyps last year---probably done with colons due to age

## 2020-08-23 NOTE — Assessment & Plan Note (Signed)
Would consider adding ARB (losartan) if worsened Probably decrease the HCTZ

## 2020-08-23 NOTE — Assessment & Plan Note (Signed)
Small plaques around---temovate gives some help Not interested in systemic Rx like MTX

## 2020-08-23 NOTE — Progress Notes (Signed)
Subjective:    Patient ID: Brooke Goodman, female    DOB: 01/22/1941, 79 y.o.   MRN: [id number]  HPI Here for Medicare wellness visit and follow up of chronic health conditions This visit occurred during the SARS-CoV-2 public health emergency.  Safety protocols were in place, including screening questions prior to the visit, additional usage of staff PPE, and extensive cleaning of exam room while observing appropriate contact time as indicated for disinfecting solutions.   Reviewed form and advanced directives Reviewed other doctors No alcohol or tobacco Not exercising--discussed  Continues on BP med This morning 106/68----up and down some from this point No dizziness or syncope No chest pain or SOB No palpitations No edema No headaches Vision okay--due for eye exam Hearing is good No falls No depression or anhedonia Independent with instrumental ADLs Some recall issues (names)---no other memory issues  BMI is 45 Weight is unchanged from last year Cooks almost exclusively---uses olive oil No sugared drinks  Last sugar 116 Nothing today for 6 hours  Uses temovate for psoriasis Not that helpful--mostly helps under breasts  Last GFR 54  Current Outpatient Medications on File Prior to Visit  Medication Sig Dispense Refill  . Cholecalciferol (VITAMIN D3) 1000 units CAPS Take by mouth.    . clobetasol cream (TEMOVATE) 4.00 % Apply 1 application topically 2 (two) times daily as needed. 60 g 3  . Multiple Vitamin (MULTIVITAMIN) tablet Take 1 tablet by mouth daily.    . Probiotic Product (PROBIOTIC DAILY PO) Take 1 tablet by mouth 2 (two) times daily.    Marland Kitchen triamterene-hydrochlorothiazide (MAXZIDE-25) 37.5-25 MG tablet TAKE 1 TABLET BY MOUTH  DAILY 90 tablet 3   No current facility-administered medications on file prior to visit.    No Known Allergies  Past Medical History:  Diagnosis Date  . Diverticulitis   . Hypertension   . Osteoarthritis   . Psoriasis   .  Restless leg syndrome   . Urinary incontinence     Past Surgical History:  Procedure Laterality Date  . COLONOSCOPY WITH PROPOFOL N/A 11/07/2018   Procedure: COLONOSCOPY WITH PROPOFOL;  Surgeon: Lollie Sails, MD;  Location: [facility] ENDOSCOPY;  Service: Endoscopy;  Laterality: N/A;  . TENDON RELEASE  07/98   right thumb  . 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 Coronary artery disease Sister   . Breast cancer Sister   . Hypertension Neg Hx   . Diabetes Neg Hx     Social History   Socioeconomic History  . Marital status: Widowed    Spouse name: Not on file  . Number of children: 3  . Years of education: Not on file  . Highest education level: Not on file  Occupational History  . Occupation: Public affairs consultant    Comment: retired 2016  Tobacco Use  . Smoking status: Never Smoker  . Smokeless tobacco: Never Used  Substance and Sexual Activity  . Alcohol use: No  . Drug use: No  . Sexual activity: Not on file  Other Topics Concern  . Not on file  Social History Narrative   Widowed 2011   No living will--has it ready though   Would want daughter [name] to make health care decisions for her. Son would be next  Would want attempts at resuscitation   Probably would accept tube feeds--but not prolonged if cognitively unaware   Social Determinants of Health   Financial Resource Strain: Not on file  Food Insecurity: Not on file  Transportation Needs: Not on file  Physical Activity: Not on file  Stress: Not on file  Social Connections: Not on file  Intimate Partner Violence: Not on file   Review of Systems Appetite is okay Weight stable Sleeps well Wears seat belt Teeth okay--keeps up dentist No suspicious skin lesions--just the psoriasis No heartburn or  dysphagia Bowels are good--no blood Some urge incontinence--especially first thing in AM. Uses tylenol prn for joint pains    Objective:   Physical Exam Constitutional:      Appearance: Normal appearance.  HENT:     Mouth/Throat:     Comments: No lesions Full upper Eyes:     Conjunctiva/sclera: Conjunctivae normal.     Pupils: Pupils are equal, round, and reactive to light.  Cardiovascular:     Rate and Rhythm: Normal rate and regular rhythm.     Heart sounds: No gallop.      Comments: Faint systolic murmur at base Faint pedal pulse on left, slightly stronger on right Pulmonary:     Effort: Pulmonary effort is normal.     Breath sounds: Normal breath sounds. No wheezing or rales.  Abdominal:     Palpations: Abdomen is soft.     Tenderness: There is no abdominal tenderness.  Musculoskeletal:     Cervical back: Neck supple.     Right lower leg: No edema.     Left lower leg: No edema.  Lymphadenopathy:     Cervical: No cervical adenopathy.  Skin:    General: Skin is warm.     Findings: No rash.  Neurological:     Mental Status: She is alert and oriented to person, place, and time.     Comments: President---"Joe Julio Sicks, Obama" [phone] [id number] Recall 3/3 (remembered last years)  Psychiatric:        Mood and Affect: Mood normal.        Behavior: Behavior normal.            Assessment & Plan:

## 2020-08-23 NOTE — Assessment & Plan Note (Signed)
No meds for this

## 2020-08-23 NOTE — Assessment & Plan Note (Signed)
Does okay with tylenol 

## 2020-11-23 ENCOUNTER — Other Ambulatory Visit: Payer: Self-pay | Admitting: Internal Medicine

## 2021-05-23 DIAGNOSIS — Z96651 Presence of right artificial knee joint: Secondary | ICD-10-CM | POA: Diagnosis not present

## 2021-08-25 ENCOUNTER — Encounter: Payer: Medicare Other | Admitting: Internal Medicine

## 2021-10-25 ENCOUNTER — Encounter: Payer: Self-pay | Admitting: Internal Medicine

## 2021-10-25 ENCOUNTER — Other Ambulatory Visit: Payer: Self-pay

## 2021-10-25 ENCOUNTER — Ambulatory Visit (INDEPENDENT_AMBULATORY_CARE_PROVIDER_SITE_OTHER): Payer: Medicare Other | Admitting: Internal Medicine

## 2021-10-25 VITALS — BP 118/80 | HR 61 | Temp 97.0°F | Ht 62.0 in | Wt 237.0 lb

## 2021-10-25 DIAGNOSIS — I10 Essential (primary) hypertension: Secondary | ICD-10-CM

## 2021-10-25 DIAGNOSIS — Z7189 Other specified counseling: Secondary | ICD-10-CM

## 2021-10-25 DIAGNOSIS — M159 Polyosteoarthritis, unspecified: Secondary | ICD-10-CM | POA: Diagnosis not present

## 2021-10-25 DIAGNOSIS — Z0000 Encounter for general adult medical examination without abnormal findings: Secondary | ICD-10-CM | POA: Diagnosis not present

## 2021-10-25 DIAGNOSIS — N1831 Chronic kidney disease, stage 3a: Secondary | ICD-10-CM

## 2021-10-25 LAB — CBC
HCT: 42 % (ref 36.0–46.0)
Hemoglobin: 13.9 g/dL (ref 12.0–15.0)
MCHC: 33 g/dL (ref 30.0–36.0)
MCV: 91.6 fl (ref 78.0–100.0)
Platelets: 277 10*3/uL (ref 150.0–400.0)
RBC: 4.59 Mil/uL (ref 3.87–5.11)
RDW: 12.4 % (ref 11.5–15.5)
WBC: 8 10*3/uL (ref 4.0–10.5)

## 2021-10-25 NOTE — Assessment & Plan Note (Signed)
Slow decline but last 48 Consider nephrology if any sig decline

## 2021-10-25 NOTE — Assessment & Plan Note (Signed)
Takes tylenol as needed

## 2021-10-25 NOTE — Assessment & Plan Note (Signed)
See social history 

## 2021-10-25 NOTE — Progress Notes (Signed)
Subjective:    Patient ID: Arie Sabina, female    DOB: January 03, 1941, 81 y.o.   MRN: [id number]  HPI Here for Medicare wellness visit and follow up of chronic health conditions Reviewed advanced directives Reviewed other doctors---[facility], Dr Clydene Pugh, Dr Smith--dentist No hospitalizations or surgery in the past year Vision is not as good---will need to get back to eye doctor Hearing is good No alcohol or tobacco No falls No depression or anhedonia Independent with instrumental ADLs No sig memory issues  No new concerns Checks her BP occasionally--- 123/68 typical No chest pain orSOB No dizziness or syncope No edema--unless prolonged sitting No palpitations  Last GFR 48 Only slightly lower over the past couple of years  Ongoing arthritis Uses tylenol prn  Clobetasol for psoriasis --mostly in crural areas  BMI 43 Still no set exercise Tries to be careful with eating  Current Outpatient Medications on File Prior to Visit  Medication Sig Dispense Refill   Cholecalciferol (VITAMIN D3) 1000 units CAPS Take by mouth.     clobetasol cream (TEMOVATE) 4.09 % Apply 1 application topically 2 (two) times daily as needed. 60 g 3   Multiple Vitamin (MULTIVITAMIN) tablet Take 1 tablet by mouth daily.     Probiotic Product (PROBIOTIC DAILY PO) Take 1 tablet by mouth 2 (two) times daily.     triamterene-hydrochlorothiazide (MAXZIDE-25) 37.5-25 MG tablet TAKE 1 TABLET BY MOUTH  DAILY 90 tablet 3   No current facility-administered medications on file prior to visit.    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   Procedure: TOTAL KNEE ARTHROPLASTY;  Surgeon: Dereck Leep, MD;  Location: [facility] ORS;  Service: Orthopedics;  Laterality: Right;   VAGINAL DELIVERY     x3    Family History  Problem Relation Age of Onset   Coronary artery disease Sister    Breast cancer Sister    Hypertension Neg Hx    Diabetes Neg Hx     Social History   Socioeconomic History   Marital status: Widowed    Spouse name: Not on file   Number of children: 3   Years of education: Not on file   Highest education level: Not on file  Occupational History   Occupation: Public affairs consultant    Comment: retired 2016  Tobacco Use   Smoking status: Never    Passive exposure: Past   Smokeless tobacco: Never  Substance and Sexual Activity   Alcohol use: No   Drug use: No   Sexual activity: Not on file  Other Topics Concern   Not on file  Social History Narrative   Widowed 2011   No living will--has it ready though   Would want daughter [name] to make health care decisions for her. Son would be next   Would want attempts at resuscitation   Probably would accept tube feeds--but not prolonged if cognitively unaware   Social Determinants of Health   Financial Resource Strain: Not on file  Food Insecurity:  Not on file  Transportation Needs: Not on file  Physical Activity: Not on file  Stress: Not on file  Social Connections: Not on file  Intimate Partner Violence: Not on file   Review of Systems Appetite is okay Weight stable Wears seat belt Teeth are okay--keeps up with dentist No suspicious skin lesions No heartburn or dysphagia Bowels move fine--no blood Voids fine---pad for mild urge incontinence No sig joint issues    Objective:   Physical Exam Constitutional:      Appearance: Normal appearance.  HENT:     Mouth/Throat:     Comments: No lesions Full upper, partial lower Eyes:     Conjunctiva/sclera:  Conjunctivae normal.     Pupils: Pupils are equal, round, and reactive to light.  Cardiovascular:     Rate and Rhythm: Normal rate and regular rhythm.     Pulses: Normal pulses.     Heart sounds: No murmur heard.   No gallop.  Pulmonary:     Effort: Pulmonary effort is normal.     Breath sounds: Normal breath sounds. No wheezing or rales.  Abdominal:     Palpations: Abdomen is soft.     Tenderness: There is no abdominal tenderness.  Musculoskeletal:     Cervical back: Neck supple.     Right lower leg: No edema.     Left lower leg: No edema.  Lymphadenopathy:     Cervical: No cervical adenopathy.  Skin:    Findings: No lesion or rash.  Neurological:     General: No focal deficit present.     Mental Status: She is alert and oriented to person, place, and time.     Comments: Mini-Cog okay. Clock good, memory 2/3  Psychiatric:        Mood and Affect: Mood normal.        Behavior: Behavior normal.           Assessment & Plan:

## 2021-10-25 NOTE — Assessment & Plan Note (Signed)
I have personally reviewed the Medicare Annual Wellness questionnaire and have noted 1. The patient's medical and social history 2. Their use of alcohol, tobacco or illicit drugs 3. Their current medications and supplements 4. The patient's functional ability including ADL's, fall risks, home safety risks and hearing or visual             impairment. 5. Diet and physical activities 6. Evidence for depression or mood disorders  The patients weight, height, BMI and visual acuity have been recorded in the chart I have made referrals, counseling and provided education to the patient based review of the above and I have provided the pt with a written personalized care plan for preventive services.  I have provided you with a copy of your personalized plan for preventive services. Please take the time to review along with your updated medication list.  Done with cancer screening due to age Recommended bivalent COVID vaccine Flu vaccine in the fall Discussed exercise--leg strengthening

## 2021-10-25 NOTE — Progress Notes (Signed)
Vision Screening   Right eye Left eye Both eyes  Without correction     With correction 20/40 20/40 20/40  Hearing Screening - Comments:: Passed whisper test' 

## 2021-10-25 NOTE — Assessment & Plan Note (Signed)
BMI still 43 Discussed exercise Healthy eating

## 2021-10-25 NOTE — Patient Instructions (Signed)

## 2021-10-25 NOTE — Assessment & Plan Note (Signed)
BP Readings from Last 3 Encounters:  10/25/21 118/80  08/23/20 120/84  08/19/19 138/80   Good control on HCTZ/triamterene 25/37.5

## 2021-10-26 LAB — RENAL FUNCTION PANEL
Albumin: 4.3 g/dL (ref 3.5–5.2)
BUN: 22 mg/dL (ref 6–23)
CO2: 31 mEq/L (ref 19–32)
Calcium: 10.2 mg/dL (ref 8.4–10.5)
Chloride: 100 mEq/L (ref 96–112)
Creatinine, Ser: 0.96 mg/dL (ref 0.40–1.20)
GFR: 55.67 mL/min — ABNORMAL LOW (ref >60.00)
Glucose, Bld: 106 mg/dL — ABNORMAL HIGH (ref 70–99)
Phosphorus: 3.6 mg/dL (ref 2.3–4.6)
Potassium: 4 mEq/L (ref 3.5–5.1)
Sodium: 140 mEq/L (ref 135–145)

## 2021-10-26 LAB — HEPATIC FUNCTION PANEL
ALT: 16 U/L (ref 0–35)
AST: 28 U/L (ref 0–37)
Albumin: 4.3 g/dL (ref 3.5–5.2)
Alkaline Phosphatase: 81 U/L (ref 39–117)
Bilirubin, Direct: 0.1 mg/dL (ref 0.0–0.3)
Total Bilirubin: 0.5 mg/dL (ref 0.2–1.2)
Total Protein: 8.3 g/dL (ref 6.0–8.3)

## 2021-11-20 ENCOUNTER — Other Ambulatory Visit: Payer: Self-pay

## 2021-11-20 ENCOUNTER — Encounter: Payer: Self-pay | Admitting: Family

## 2021-11-20 ENCOUNTER — Ambulatory Visit (INDEPENDENT_AMBULATORY_CARE_PROVIDER_SITE_OTHER): Payer: Medicare Other | Admitting: Family

## 2021-11-20 ENCOUNTER — Telehealth: Payer: Self-pay

## 2021-11-20 VITALS — BP 120/84 | Ht 61.0 in | Wt 236.0 lb

## 2021-11-20 DIAGNOSIS — H8112 Benign paroxysmal vertigo, left ear: Secondary | ICD-10-CM

## 2021-11-20 DIAGNOSIS — J301 Allergic rhinitis due to pollen: Secondary | ICD-10-CM | POA: Diagnosis not present

## 2021-11-20 MED ORDER — FLUTICASONE PROPIONATE 50 MCG/ACT NA SUSP: NASAL | 0 refills | Status: DC

## 2021-11-20 MED ORDER — MECLIZINE HCL 12.5 MG PO TABS: 12.5000 mg | ORAL_TABLET | Freq: Three times a day (TID) | ORAL | 0 refills | Status: DC | PRN

## 2021-11-20 NOTE — Patient Instructions (Signed)
A referral was placed today for ENT.  ?Please let us know if you have not heard back within 1 week about your referral. ? ?Start flonase daily as well as meclizine for vertigo as needed.  ? ? ? ?It was a pleasure seeing you today! Please do not hesitate to reach out with any questions and or concerns. ? ?Regards,  ? ?[place] ?FNP-C ? ? ?

## 2021-11-20 NOTE — Progress Notes (Unsigned)
Established Patient Office Visit  Subjective:  Patient ID: Brooke Goodman, female    DOB: 1941-03-02  Age: 81 y.o. MRN: [id number]  CC:  Chief Complaint  Patient presents with   Dizziness    Pt stated--having dizzy spell when ready to stand or turn the head--3 day    HPI Brooke Goodman is here today with concerns.   Friday had unsteady gait and was walking side to side.  Saturday she felt motion sick and she states she threw up on and off throughout the day but not since.  If she turns her head she feels she gets 'swimmy headed' Feels like she hears crickets in her left ear.  Did take sudafed and benadryl prior to coming here.   No recent sickness that she knows of. No nasal congestion no sore throat.  No cp palp and or sob.   Orthostatic VS for the past 72 hrs (Last 3 readings):  Orthostatic BP Patient Position BP Location Cuff Size Orthostatic Pulse  11/20/21 1115 124/90 Standing Left Arm Large --  11/20/21 1108 122/86 Sitting Left Arm Large 75     Past Medical History:  Diagnosis Date   Diverticulitis    Hypertension    Osteoarthritis    Psoriasis    Restless leg syndrome    Urinary incontinence     Past Surgical History:  Procedure Laterality Date   COLONOSCOPY WITH PROPOFOL N/A 11/07/2018   Procedure: COLONOSCOPY WITH PROPOFOL;  Surgeon: Lollie Sails, MD;  Location: [facility] ENDOSCOPY;  Service: Endoscopy;  Laterality: N/A;   TENDON RELEASE  07/98   right thumb   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   Procedure: TOTAL KNEE ARTHROPLASTY;  Surgeon: Dereck Leep, MD;  Location: [facility] ORS;  Service: Orthopedics;  Laterality: Right;   VAGINAL DELIVERY     x3    Family History  Problem Relation Age of Onset   Coronary artery disease Sister    Breast cancer Sister    Hypertension Neg Hx    Diabetes Neg Hx     Social History   Socioeconomic History   Marital status: Widowed    Spouse name: Not on file   Number of children: 3   Years of  education: Not on file   Highest education level: Not on file  Occupational History   Occupation: Public affairs consultant    Comment: retired 2016  Tobacco Use   Smoking status: Never    Passive exposure: Past   Smokeless tobacco: Never  Substance and Sexual Activity   Alcohol use: No   Drug use: No   Sexual activity: Not on file  Other Topics Concern   Not on file  Social History Narrative   Widowed 2011   No living will--has it ready though   Would want daughter [name] to make health care decisions for her. Son would be next   Would want attempts at resuscitation   Probably would accept tube feeds--but not prolonged if cognitively unaware   Social Determinants of Health   Financial Resource Strain: Not on file  Food Insecurity: Not on file  Transportation Needs: Not on file  Physical Activity: Not on file  Stress: Not on file  Social Connections: Not on file  Intimate Partner Violence: Not on file    Outpatient Medications Prior to Visit  Medication Sig Dispense Refill   Cholecalciferol (VITAMIN D3) 1000 units CAPS Take by mouth.     clobetasol cream (TEMOVATE) 1.16 % Apply 1 application topically  2 (two) times daily as needed. 60 g 3   Multiple Vitamin (MULTIVITAMIN) tablet Take 1 tablet by mouth daily.     Probiotic Product (PROBIOTIC DAILY PO) Take 1 tablet by mouth 2 (two) times daily.     triamterene-hydrochlorothiazide (MAXZIDE-25) 37.5-25 MG tablet TAKE 1 TABLET BY MOUTH  DAILY 90 tablet 3   No facility-administered medications prior to visit.    No Known Allergies  ROS Review of Systems  Constitutional:  Negative for chills and fatigue.  Respiratory:  Negative for cough and shortness of breath.   Cardiovascular:  Negative for chest pain and leg swelling.  Gastrointestinal:  Negative for diarrhea and nausea.  Genitourinary:  Negative for difficulty urinating.  Neurological:  Positive for dizziness (upon rising or turnin ghead). Negative for tremors and weakness.   Psychiatric/Behavioral:  Negative for agitation and sleep disturbance.   All other systems reviewed and are negative.    Objective:    Physical Exam Cardiovascular:     Rate and Rhythm: Normal rate.    Ht '5\' 1"'$  (1.549 m)    Wt 236 lb (107 kg)    SpO2 99%    BMI 44.59 kg/m  Wt Readings from Last 3 Encounters:  11/20/21 236 lb (107 kg)  10/25/21 237 lb (107.5 kg)  08/23/20 239 lb (108.4 kg)     Health Maintenance Due  Topic Date Due   DEXA SCAN  Never done   COVID-19 Vaccine (4 - Booster for Pfizer series) 08/06/2020    There are no preventive care reminders to display for this patient.  Lab Results  Component Value Date   TSH 1.96 07/22/2013   Lab Results  Component Value Date   WBC 8.0 10/25/2021   HGB 13.9 10/25/2021   HCT 42.0 10/25/2021   MCV 91.6 10/25/2021   PLT 277.0 10/25/2021   Lab Results  Component Value Date   NA 140 10/25/2021   K 4.0 10/25/2021   CO2 31 10/25/2021   GLUCOSE 106 (H) 10/25/2021   BUN 22 10/25/2021   CREATININE 0.96 10/25/2021   BILITOT 0.5 10/25/2021   ALKPHOS 81 10/25/2021   AST 28 10/25/2021   ALT 16 10/25/2021   PROT 8.3 10/25/2021   ALBUMIN 4.3 10/25/2021   ALBUMIN 4.3 10/25/2021   CALCIUM 10.2 10/25/2021   ANIONGAP 10 04/13/2016   GFR 55.67 (L) 10/25/2021   Lab Results  Component Value Date   HGBA1C 5.9 08/23/2020      Assessment & Plan:   Problem List Items Addressed This Visit   None   No orders of the defined types were placed in this encounter.   Follow-up: No follow-ups on file.    Eugenia Pancoast, FNP

## 2021-11-20 NOTE — Telephone Encounter (Signed)
[place] Night - Client ?Nonclinical Telephone Record  ?[facility]? ?Client [place] Night - Client ?Client Site [place] ?Provider Viviana Simpler- MD ?Contact Type Call ?Who Is Calling Patient / Member / Family / Caregiver ?Caller Name Brooke Goodman ?Caller Phone Number [phone] ?Patient Name Brooke Goodman ?Patient DOB 1941-08-09 ?Call Type Message Only Information Provided ?Reason for Call Medication Question / Request ?Initial Comment Caller states she is needing an rx called in for vertigo. ?Disp. Time Disposition Final User ?11/20/2021 7:53:52 AM General Information Provided Yes Zane Herald ?Call Closed By: Zane Herald ?Transaction Date/Time: 11/20/2021 7:51:36 AM (ET ? ?Pt already scheduled VV with T Dugal FNP 11/20/21 at 11:00. ?

## 2021-11-20 NOTE — Telephone Encounter (Signed)
I spoke with pt earlier and pt changed 11/20/21 11 AM appt to in office appt. With UC & ED precautions and pt voiced understanding. ?

## 2021-11-21 DIAGNOSIS — J301 Allergic rhinitis due to pollen: Secondary | ICD-10-CM | POA: Insufficient documentation

## 2021-11-21 DIAGNOSIS — H8112 Benign paroxysmal vertigo, left ear: Secondary | ICD-10-CM | POA: Insufficient documentation

## 2021-11-21 NOTE — Assessment & Plan Note (Signed)
Suspect that control of allergies may help the vertigo in the future.  ?Recommend continue zyrtec and start flonase daily. ?

## 2021-11-21 NOTE — Assessment & Plan Note (Addendum)
Meclizine prn  ?Rise slowly from seated or lying position  ?Referred to ENT as if no improvement may need epley maneuver ?

## 2021-11-23 ENCOUNTER — Other Ambulatory Visit: Payer: Self-pay | Admitting: Internal Medicine

## 2021-12-21 ENCOUNTER — Other Ambulatory Visit: Payer: Self-pay | Admitting: Internal Medicine

## 2021-12-22 NOTE — Telephone Encounter (Signed)
Is this okay to refill pt has not had this since 05/2019  ?

## 2022-05-24 DIAGNOSIS — Z96651 Presence of right artificial knee joint: Secondary | ICD-10-CM | POA: Diagnosis not present

## 2022-05-31 ENCOUNTER — Telehealth: Payer: Self-pay | Admitting: *Deleted

## 2022-05-31 NOTE — Patient Outreach (Signed)
  Care Coordination   Follow Up Visit Note   05/31/2022 Name: ROSALYND MCWRIGHT MRN: [id number] DOB: 1941-08-03  JAQULYN CHANCELLOR is a 81 y.o. year old female who sees Venia Carbon, MD for primary care. I spoke with  Arie Sabina by phone today.  What matters to the patients health and wellness today?  RN discussed services [facility] services, RN, SW, and Pharmacist. Patient declined services.     Goals Addressed   None     SDOH assessments and interventions completed:  No     Care Coordination Interventions Activated:  No  Care Coordination Interventions:  No, not indicated   Follow up plan: No further intervention required.   Encounter Outcome:  Pt. [place] [facility] [phone]

## 2022-05-31 NOTE — Patient Outreach (Signed)
  Care Coordination   05/31/2022 Name: ABREA HENLE MRN: [id number] DOB: 09-14-1940   Care Coordination Outreach Attempts:  An unsuccessful telephone outreach was attempted today to offer the patient information about available care coordination services as a benefit of their health plan.   Follow Up Plan:  Additional outreach attempts will be made to offer the patient care coordination information and services.   Encounter Outcome:  No Answer  Care Coordination Interventions Activated:  Yes   Care Coordination Interventions:  No, not indicated    [place] [facility] [phone]

## 2022-10-26 ENCOUNTER — Ambulatory Visit (INDEPENDENT_AMBULATORY_CARE_PROVIDER_SITE_OTHER): Payer: Medicare Other | Admitting: Internal Medicine

## 2022-10-26 ENCOUNTER — Encounter: Payer: Self-pay | Admitting: Internal Medicine

## 2022-10-26 VITALS — BP 132/84 | HR 85 | Temp 97.8°F | Ht 62.0 in | Wt 233.0 lb

## 2022-10-26 DIAGNOSIS — I10 Essential (primary) hypertension: Secondary | ICD-10-CM

## 2022-10-26 DIAGNOSIS — N1831 Chronic kidney disease, stage 3a: Secondary | ICD-10-CM

## 2022-10-26 DIAGNOSIS — Z0000 Encounter for general adult medical examination without abnormal findings: Secondary | ICD-10-CM

## 2022-10-26 LAB — LIPID PANEL
Cholesterol: 226 mg/dL — ABNORMAL HIGH (ref 0–200)
HDL: 80.4 mg/dL (ref >39.00)
LDL Cholesterol: 129 mg/dL — ABNORMAL HIGH (ref 0–99)
NonHDL: 145.82
Total CHOL/HDL Ratio: 3
Triglycerides: 82 mg/dL (ref 0.0–149.0)
VLDL: 16.4 mg/dL (ref 0.0–40.0)

## 2022-10-26 LAB — COMPREHENSIVE METABOLIC PANEL
ALT: 14 U/L (ref 0–35)
AST: 21 U/L (ref 0–37)
Albumin: 4.1 g/dL (ref 3.5–5.2)
Alkaline Phosphatase: 94 U/L (ref 39–117)
BUN: 26 mg/dL — ABNORMAL HIGH (ref 6–23)
CO2: 32 mEq/L (ref 19–32)
Calcium: 9.9 mg/dL (ref 8.4–10.5)
Chloride: 98 mEq/L (ref 96–112)
Creatinine, Ser: 1.04 mg/dL (ref 0.40–1.20)
GFR: 50.22 mL/min — ABNORMAL LOW (ref >60.00)
Glucose, Bld: 123 mg/dL — ABNORMAL HIGH (ref 70–99)
Potassium: 4.4 mEq/L (ref 3.5–5.1)
Sodium: 140 mEq/L (ref 135–145)
Total Bilirubin: 0.5 mg/dL (ref 0.2–1.2)
Total Protein: 7 g/dL (ref 6.0–8.3)

## 2022-10-26 LAB — CBC
HCT: 42.5 % (ref 36.0–46.0)
Hemoglobin: 13.9 g/dL (ref 12.0–15.0)
MCHC: 32.8 g/dL (ref 30.0–36.0)
MCV: 93.2 fl (ref 78.0–100.0)
Platelets: 346 10*3/uL (ref 150.0–400.0)
RBC: 4.56 Mil/uL (ref 3.87–5.11)
RDW: 12.3 % (ref 11.5–15.5)
WBC: 9.2 10*3/uL (ref 4.0–10.5)

## 2022-10-26 LAB — TSH: TSH: 2.31 u[IU]/mL (ref 0.35–5.50)

## 2022-10-26 NOTE — Assessment & Plan Note (Signed)
BMI down slightly Is working on activity and better food choices

## 2022-10-26 NOTE — Assessment & Plan Note (Signed)
I have personally reviewed the Medicare Annual Wellness questionnaire and have noted 1. The patient's medical and social history 2. Their use of alcohol, tobacco or illicit drugs 3. Their current medications and supplements 4. The patient's functional ability including ADL's, fall risks, home safety risks and hearing or visual             impairment. 5. Diet and physical activities 6. Evidence for depression or mood disorders  The patients weight, height, BMI and visual acuity have been recorded in the chart I have made referrals, counseling and provided education to the patient based review of the above and I have provided the pt with a written personalized care plan for preventive services.  I have provided you with a copy of your personalized plan for preventive services. Please take the time to review along with your updated medication list.  Done with cancer screening Had RSV and flu vaccines (will need flu again in the fall) Updated COVID due Discussed lifestyle--she is trying

## 2022-10-26 NOTE — Progress Notes (Signed)
Subjective:    Patient ID: Brooke Goodman, female    DOB: 04-Apr-1941, 82 y.o.   MRN: [id number]  HPI Here for Medicare wellness visit and follow up of chronic health conditions Reviewed advanced directives Reviewed other doctors---Dr Richardson--optometrist, Dr Oren Bracket, Dr Gustavus Bryant No hospitalizations or surgery in the past year Due for eye checkup and new glasses Hearing is okay No alcohol or tobacco  Is trying to do some exercise--more careful with eating No falls No depression or anhedonia Independent with instrumental ADLs Some recall issues but no sig memory problems  Doing well No new concerns  BMI down slightly---to 42 Is trying  No chest pain No palpitations No dizziness or syncope No regular headaches--rare tension headache No edema No SOB  Last GFR 55--that was up from the year before  Ongoing joint pain at times Uses tylenol prn only  Still gets urge incontinence Does try exercises Wears pad just in case  Current Outpatient Medications on File Prior to Visit  Medication Sig Dispense Refill   Cholecalciferol (VITAMIN D3) 1000 units CAPS Take by mouth.     clobetasol cream (TEMOVATE) 0.05 % APPLY TOPICALLY TWO TIMES A DAY AS NEEDED 60 g 1   Multiple Vitamin (MULTIVITAMIN) tablet Take 1 tablet by mouth daily.     Probiotic Product (PROBIOTIC DAILY PO) Take 1 tablet by mouth 2 (two) times daily.     triamterene-hydrochlorothiazide (MAXZIDE-25) 37.5-25 MG tablet TAKE 1 TABLET BY MOUTH  DAILY 90 tablet 3   No current facility-administered medications on file prior to visit.    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   Procedure: TOTAL KNEE ARTHROPLASTY;  Surgeon: Dereck Leep, MD;  Location: [facility] ORS;  Service: Orthopedics;  Laterality: Right;   VAGINAL DELIVERY     x3    Family History  Problem Relation Age of Onset   Coronary artery disease Sister    Breast cancer Sister    Hypertension Neg Hx    Diabetes Neg Hx     Social History   Socioeconomic History   Marital status: Widowed    Spouse name: Not on file   Number of children: 3   Years of education: Not on file   Highest education level: Not on file  Occupational History   Occupation: Public affairs consultant    Comment: retired 2016  Tobacco Use   Smoking status: Never    Passive exposure: Past   Smokeless tobacco: Never  Vaping Use   Vaping Use: Never used  Substance and Sexual Activity   Alcohol use: No   Drug use: No   Sexual activity: Not on file  Other Topics Concern   Not on file  Social History Narrative   Widowed 2011   No living will--has it ready though   Would want daughter Brooke Goodman to make health care decisions for her. Son would be next   Would want attempts at resuscitation   Probably would accept tube feeds--but not prolonged if cognitively unaware  Social Determinants of Health   Financial Resource Strain: Not on file  Food Insecurity: Not on file  Transportation Needs: Not on file  Physical Activity: Not on file  Stress: Not on file  Social Connections: Not on file  Intimate Partner Violence: Not on file   Review of Systems Appetite is fine Weight fairly stable Sleeps okay mostly Wears seat belt Teeth are fine---keeps up with dentist Chronic psoriasis--uses cream prn only. No suspicious spots    Objective:   Physical Exam Constitutional:      Appearance: Normal appearance.  HENT:     Mouth/Throat:     Pharynx: No oropharyngeal exudate or posterior oropharyngeal  erythema.     Comments: Full upper Partial lower Eyes:     Conjunctiva/sclera: Conjunctivae normal.     Pupils: Pupils are equal, round, and reactive to light.  Cardiovascular:     Rate and Rhythm: Normal rate and regular rhythm.     Pulses: Normal pulses.     Heart sounds:     No gallop.     Comments: Gr 2/6 systolic murmur left sternal border Pulmonary:     Effort: Pulmonary effort is normal.     Breath sounds: Normal breath sounds. No wheezing or rales.  Abdominal:     Palpations: Abdomen is soft.     Tenderness: There is no abdominal tenderness.  Musculoskeletal:     Cervical back: Neck supple.     Right lower leg: No edema.     Left lower leg: No edema.  Lymphadenopathy:     Cervical: No cervical adenopathy.  Skin:    Comments: Scattered psoriatic plaques  Neurological:     General: No focal deficit present.     Mental Status: She is alert and oriented to person, place, and time.     Comments: Word naming 14/1 minute Recall 3/3  Psychiatric:        Mood and Affect: Mood normal.        Behavior: Behavior normal.            Assessment & Plan:

## 2022-10-26 NOTE — Assessment & Plan Note (Signed)
GFR actually up some last year Will recheck On vitamin D Consider low dose ARB if worsens ---she prefers no nephrology

## 2022-10-26 NOTE — Progress Notes (Signed)
Vision Screening   Right eye Left eye Both eyes  Without correction     With correction 20/50 20/50 20/40 $  Hearing Screening - Comments:: Passed whisper test

## 2022-10-26 NOTE — Assessment & Plan Note (Signed)
BP Readings from Last 3 Encounters:  10/26/22 132/84  11/21/21 120/84  10/25/21 118/80   Controlled on the HCTZ/triamterene 25/37.5

## 2022-11-18 ENCOUNTER — Other Ambulatory Visit: Payer: Self-pay | Admitting: Internal Medicine

## 2023-09-22 ENCOUNTER — Other Ambulatory Visit: Payer: Self-pay | Admitting: Internal Medicine

## 2023-10-28 ENCOUNTER — Ambulatory Visit (INDEPENDENT_AMBULATORY_CARE_PROVIDER_SITE_OTHER): Payer: Medicare Other | Admitting: Internal Medicine

## 2023-10-28 ENCOUNTER — Encounter: Payer: Self-pay | Admitting: Internal Medicine

## 2023-10-28 VITALS — BP 112/78 | HR 108 | Temp 98.6°F | Ht 61.0 in | Wt 234.0 lb

## 2023-10-28 DIAGNOSIS — L409 Psoriasis, unspecified: Secondary | ICD-10-CM | POA: Diagnosis not present

## 2023-10-28 DIAGNOSIS — N1831 Chronic kidney disease, stage 3a: Secondary | ICD-10-CM

## 2023-10-28 DIAGNOSIS — Z0000 Encounter for general adult medical examination without abnormal findings: Secondary | ICD-10-CM | POA: Diagnosis not present

## 2023-10-28 DIAGNOSIS — I10 Essential (primary) hypertension: Secondary | ICD-10-CM | POA: Diagnosis not present

## 2023-10-28 LAB — HEPATIC FUNCTION PANEL
ALT: 16 U/L (ref 0–35)
AST: 24 U/L (ref 0–37)
Albumin: 4.4 g/dL (ref 3.5–5.2)
Alkaline Phosphatase: 93 U/L (ref 39–117)
Bilirubin, Direct: 0.1 mg/dL (ref 0.0–0.3)
Total Bilirubin: 0.6 mg/dL (ref 0.2–1.2)
Total Protein: 7.1 g/dL (ref 6.0–8.3)

## 2023-10-28 LAB — RENAL FUNCTION PANEL
Albumin: 4.4 g/dL (ref 3.5–5.2)
BUN: 19 mg/dL (ref 6–23)
CO2: 27 meq/L (ref 19–32)
Calcium: 9.8 mg/dL (ref 8.4–10.5)
Chloride: 97 meq/L (ref 96–112)
Creatinine, Ser: 0.94 mg/dL (ref 0.40–1.20)
GFR: 56.3 mL/min — ABNORMAL LOW (ref >60.00)
Glucose, Bld: 123 mg/dL — ABNORMAL HIGH (ref 70–99)
Phosphorus: 3.5 mg/dL (ref 2.3–4.6)
Potassium: 4.1 meq/L (ref 3.5–5.1)
Sodium: 139 meq/L (ref 135–145)

## 2023-10-28 LAB — CBC
HCT: 44.5 % (ref 36.0–46.0)
Hemoglobin: 14.5 g/dL (ref 12.0–15.0)
MCHC: 32.6 g/dL (ref 30.0–36.0)
MCV: 94 fL (ref 78.0–100.0)
Platelets: 308 10*3/uL (ref 150.0–400.0)
RBC: 4.73 Mil/uL (ref 3.87–5.11)
RDW: 13 % (ref 11.5–15.5)
WBC: 9.7 10*3/uL (ref 4.0–10.5)

## 2023-10-28 NOTE — Assessment & Plan Note (Signed)
BP Readings from Last 3 Encounters:  10/28/23 112/78  10/26/22 132/84  11/21/21 120/84   Doing well on the maxzide 25 Will check labs

## 2023-10-28 NOTE — Assessment & Plan Note (Signed)
Okay with just topical Rx Doesn't have any interest in the new available biologics

## 2023-10-28 NOTE — Progress Notes (Signed)
Vision Screening   Right eye Left eye Both eyes  Without correction     With correction 20/50 20/50 20/40 $  Hearing Screening - Comments:: Passed whisper test

## 2023-10-28 NOTE — Progress Notes (Signed)
Subjective:    Patient ID: Brooke Goodman, female    DOB: 1940-12-12, 83 y.o.   MRN: [id number]  HPI Here for Medicare wellness visit and follow up of chronic health conditions Reviewed advanced directives Reviewed other doctors---Dr Richardson--opto, Dr Schuyler Amor, Dr Arita Miss No hospitalizations or surgery in the past year Not much exercise--discussed resistance Vision is not great---due for exam Hearing is good No alcohol or tobacco No falls No depression or anhedonia Independent with instrumental ADLs No sig memory issues---just name recall  No new concerns  Weight fairly stable---BMI still 44 Careful about eating Walks doing her chores and shopping  BMI ranging from 48 to 58 over the past 6 years  Wears pad all the time Just urge incontinence Mostly trouble at night---and gets up every 3 hours  Ongoing arthritis pain Uses tylenol arthritis as needed  No chest pain or SOB Occasional racing feeling---very brief (like thinking about coming to the doctor) No dizziness or syncope  Current Outpatient Medications on File Prior to Visit  Medication Sig Dispense Refill   Cholecalciferol (VITAMIN D3) 1000 units CAPS Take by mouth.     clobetasol cream (TEMOVATE) 0.05 % APPLY TOPICALLY TWO TIMES A DAY AS NEEDED 60 g 1   Multiple Vitamin (MULTIVITAMIN) tablet Take 1 tablet by mouth daily.     Probiotic Product (PROBIOTIC DAILY PO) Take 1 tablet by mouth 2 (two) times daily.     triamterene-hydrochlorothiazide (MAXZIDE-25) 37.5-25 MG tablet TAKE 1 TABLET BY MOUTH DAILY 100 tablet 0   No current facility-administered medications on file prior to visit.    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   Procedure: TOTAL KNEE ARTHROPLASTY;  Surgeon: Donato Heinz, MD;  Location: [facility] ORS;  Service: Orthopedics;  Laterality: Right;   VAGINAL DELIVERY     x3    Family History  Problem Relation Age of Onset   Coronary artery disease Sister    Breast cancer Sister    Hypertension Neg Hx    Diabetes Neg Hx     Social History   Socioeconomic History   Marital status: Widowed    Spouse name: Not on file   Number of children: 3   Years of education: Not on file   Highest education level: Not on file  Occupational History   Occupation: Production assistant, radio    Comment: retired 2016  Tobacco Use   Smoking status: Never    Passive exposure: Past   Smokeless tobacco: Never  Vaping Use   Vaping status: Never Used  Substance and Sexual Activity   Alcohol use: No   Drug use: No   Sexual activity: Not on file  Other Topics Concern   Not on file  Social History Narrative   Widowed 2011   No living will--has it ready though   Would want daughter Burna Mortimer to make health care decisions for her. Son would be next   Would want attempts at resuscitation   Probably would accept tube feeds--but not  prolonged if cognitively unaware   Social Drivers of Corporate investment banker Strain: Not on file  Food Insecurity: Not on file  Transportation Needs: Not on file  Physical Activity: Not on file  Stress: Not on file  Social Connections: Not on file  Intimate Partner Violence: Not on file   Review of Systems Appetite is good Sleep is okay--other than the nocturia Wears seat belt Teeth are okay--keeps up with dentist No suspicious skin lesions----just ongoing psoriasis (uses the clobetasol prn or OTC cortisone) Bowels move fine --no blood No heartburn or dysphagia    Objective:   Physical  Exam Constitutional:      Appearance: Normal appearance. She is obese.  HENT:     Mouth/Throat:     Pharynx: No oropharyngeal exudate or posterior oropharyngeal erythema.  Eyes:     Conjunctiva/sclera: Conjunctivae normal.     Pupils: Pupils are equal, round, and reactive to light.  Cardiovascular:     Rate and Rhythm: Normal rate and regular rhythm.     Pulses: Normal pulses.     Heart sounds: No murmur heard.    No gallop.  Pulmonary:     Effort: Pulmonary effort is normal.     Breath sounds: Normal breath sounds. No wheezing or rales.  Abdominal:     Palpations: Abdomen is soft.     Tenderness: There is no abdominal tenderness.  Musculoskeletal:     Cervical back: Neck supple.     Right lower leg: No edema.     Left lower leg: No edema.  Lymphadenopathy:     Cervical: No cervical adenopathy.  Skin:    Comments: Scattered psoriatic plaques  Neurological:     General: No focal deficit present.     Mental Status: She is alert and oriented to person, place, and time.  Psychiatric:        Mood and Affect: Mood normal.        Behavior: Behavior normal.            Assessment & Plan:

## 2023-10-28 NOTE — Assessment & Plan Note (Signed)
Weight stable Discussed lifestyle again

## 2023-10-28 NOTE — Assessment & Plan Note (Signed)
Has been mildly low for years and stable Will just recheck

## 2023-10-28 NOTE — Assessment & Plan Note (Signed)
I have personally reviewed the Medicare Annual Wellness questionnaire and have noted 1. The patient's medical and social history 2. Their use of alcohol, tobacco or illicit drugs 3. Their current medications and supplements 4. The patient's functional ability including ADL's, fall risks, home safety risks and hearing or visual             impairment. 5. Diet and physical activities 6. Evidence for depression or mood disorders  The patients weight, height, BMI and visual acuity have been recorded in the chart I have made referrals, counseling and provided education to the patient based review of the above and I have provided the pt with a written personalized care plan for preventive services.  I have provided you with a copy of your personalized plan for preventive services. Please take the time to review along with your updated medication list.  Done with cancer screening Has had all needed vaccines Discussed fitness/lifestyle

## 2023-12-29 ENCOUNTER — Other Ambulatory Visit: Payer: Self-pay | Admitting: Internal Medicine

## 2024-04-13 ENCOUNTER — Encounter: Payer: Self-pay | Admitting: Internal Medicine

## 2024-06-26 ENCOUNTER — Telehealth: Payer: Self-pay

## 2024-06-26 NOTE — Telephone Encounter (Signed)
 Copied from CRM [id number]. Topic: Appointments - Transfer of Care >> Jun 26, 2024 10:11 AM Alfonso ORN wrote: Pt is requesting to transfer FROM: Brooke Goodman Pt is requesting to transfer TO: Bennett Patella Reason for requested transfer: pcp retired It is the responsibility of the team the patient would like to transfer to (Dr. Bennett) to reach out to the patient if for any reason this transfer is not acceptable.

## 2024-06-28 NOTE — Telephone Encounter (Signed)
 TOC accepted Noted thanks

## 2024-11-09 ENCOUNTER — Encounter
